# Patient Record
Sex: Female | Born: 1985 | Race: Asian | Hispanic: No | Smoking: Never smoker
Health system: Southern US, Community
[De-identification: ages and names within clinical notes are randomized; demographics above are authoritative.]

## PROBLEM LIST (undated history)

## (undated) ENCOUNTER — Encounter

## (undated) ENCOUNTER — Telehealth: Attending: Family | Primary: Family

## (undated) ENCOUNTER — Telehealth

## (undated) ENCOUNTER — Encounter: Attending: Family | Primary: Family

## (undated) ENCOUNTER — Ambulatory Visit

## (undated) ENCOUNTER — Encounter: Attending: Gastroenterology | Primary: Gastroenterology

## (undated) ENCOUNTER — Ambulatory Visit: Payer: PRIVATE HEALTH INSURANCE | Attending: Gastroenterology | Primary: Gastroenterology

## (undated) ENCOUNTER — Inpatient Hospital Stay (HOSPITAL_COMMUNITY): Payer: Self-pay

## (undated) DIAGNOSIS — F32A Depression, unspecified: Secondary | ICD-10-CM

## (undated) DIAGNOSIS — O99345 Other mental disorders complicating the puerperium: Secondary | ICD-10-CM

## (undated) DIAGNOSIS — F53 Postpartum depression: Secondary | ICD-10-CM

## (undated) DIAGNOSIS — F419 Anxiety disorder, unspecified: Secondary | ICD-10-CM

## (undated) DIAGNOSIS — O329XX0 Maternal care for malpresentation of fetus, unspecified, not applicable or unspecified: Secondary | ICD-10-CM

## (undated) DIAGNOSIS — R0600 Dyspnea, unspecified: Secondary | ICD-10-CM

## (undated) DIAGNOSIS — K759 Inflammatory liver disease, unspecified: Secondary | ICD-10-CM

## (undated) DIAGNOSIS — J45909 Unspecified asthma, uncomplicated: Secondary | ICD-10-CM

## (undated) DIAGNOSIS — F329 Major depressive disorder, single episode, unspecified: Secondary | ICD-10-CM

## (undated) HISTORY — PX: NO PAST SURGERIES: SHX2092

## (undated) HISTORY — DX: Unspecified asthma, uncomplicated: J45.909

## (undated) HISTORY — DX: Other mental disorders complicating the puerperium: O99.345

## (undated) HISTORY — DX: Anxiety disorder, unspecified: F41.9

## (undated) HISTORY — DX: Postpartum depression: F53.0

## (undated) HISTORY — PX: WISDOM TOOTH EXTRACTION: SHX21

## (undated) HISTORY — DX: Maternal care for malpresentation of fetus, unspecified, not applicable or unspecified: O32.9XX0

## (undated) HISTORY — DX: Dyspnea, unspecified: R06.00

---

## 2006-05-01 ENCOUNTER — Emergency Department (HOSPITAL_COMMUNITY): Admission: EM | Admit: 2006-05-01 | Discharge: 2006-05-01 | Payer: Self-pay | Admitting: Emergency Medicine

## 2006-07-24 ENCOUNTER — Ambulatory Visit (HOSPITAL_COMMUNITY): Admission: RE | Admit: 2006-07-24 | Discharge: 2006-07-24 | Payer: Self-pay | Admitting: Obstetrics & Gynecology

## 2006-07-31 ENCOUNTER — Ambulatory Visit: Payer: Self-pay | Admitting: Obstetrics and Gynecology

## 2006-07-31 ENCOUNTER — Inpatient Hospital Stay (HOSPITAL_COMMUNITY): Admission: AD | Admit: 2006-07-31 | Discharge: 2006-07-31 | Payer: Self-pay | Admitting: Obstetrics and Gynecology

## 2006-12-04 ENCOUNTER — Inpatient Hospital Stay (HOSPITAL_COMMUNITY): Admission: AD | Admit: 2006-12-04 | Discharge: 2006-12-04 | Payer: Self-pay | Admitting: Gynecology

## 2006-12-04 ENCOUNTER — Ambulatory Visit: Payer: Self-pay | Admitting: Certified Nurse Midwife

## 2006-12-10 ENCOUNTER — Inpatient Hospital Stay (HOSPITAL_COMMUNITY): Admission: AD | Admit: 2006-12-10 | Discharge: 2006-12-12 | Payer: Self-pay | Admitting: Gynecology

## 2006-12-10 ENCOUNTER — Ambulatory Visit: Payer: Self-pay | Admitting: Certified Nurse Midwife

## 2006-12-30 ENCOUNTER — Inpatient Hospital Stay (HOSPITAL_COMMUNITY): Admission: AD | Admit: 2006-12-30 | Discharge: 2006-12-30 | Payer: Self-pay | Admitting: Obstetrics & Gynecology

## 2006-12-30 ENCOUNTER — Ambulatory Visit: Payer: Self-pay | Admitting: *Deleted

## 2008-01-02 ENCOUNTER — Inpatient Hospital Stay (HOSPITAL_COMMUNITY): Admission: AD | Admit: 2008-01-02 | Discharge: 2008-01-02 | Payer: Self-pay | Admitting: Obstetrics and Gynecology

## 2011-04-25 LAB — POCT PREGNANCY, URINE
Operator id: 11741
Preg Test, Ur: POSITIVE

## 2011-05-16 LAB — URINALYSIS, ROUTINE W REFLEX MICROSCOPIC
Bilirubin Urine: NEGATIVE
Ketones, ur: NEGATIVE
Nitrite: NEGATIVE
Protein, ur: NEGATIVE
pH: 7.5

## 2011-05-16 LAB — CBC
Hemoglobin: 13.7
MCHC: 32.2
Platelets: 356
RDW: 16.2 — ABNORMAL HIGH

## 2012-10-27 ENCOUNTER — Ambulatory Visit (INDEPENDENT_AMBULATORY_CARE_PROVIDER_SITE_OTHER): Payer: PRIVATE HEALTH INSURANCE | Admitting: Family Medicine

## 2012-10-27 VITALS — BP 90/60 | HR 68 | Temp 98.6°F | Resp 12 | Ht 62.0 in | Wt 119.0 lb

## 2012-10-27 DIAGNOSIS — M659 Synovitis and tenosynovitis, unspecified: Secondary | ICD-10-CM

## 2012-10-27 DIAGNOSIS — N6012 Diffuse cystic mastopathy of left breast: Secondary | ICD-10-CM

## 2012-10-27 DIAGNOSIS — M7750 Other enthesopathy of unspecified foot: Secondary | ICD-10-CM

## 2012-10-27 DIAGNOSIS — K219 Gastro-esophageal reflux disease without esophagitis: Secondary | ICD-10-CM

## 2012-10-27 DIAGNOSIS — N6019 Diffuse cystic mastopathy of unspecified breast: Secondary | ICD-10-CM

## 2012-10-27 MED ORDER — PREDNISONE 20 MG PO TABS: ORAL_TABLET | ORAL | Status: DC

## 2012-10-27 NOTE — Progress Notes (Signed)
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

## 2012-10-27 NOTE — Patient Instructions (Addendum)
Gastroesophageal Reflux Disease, Adult Gastroesophageal reflux disease (GERD) happens when acid from your stomach flows up into the esophagus. When acid comes in contact with the esophagus, the acid causes soreness (inflammation) in the esophagus. Over time, GERD may create small holes (ulcers) in the lining of the esophagus. CAUSES   Increased body weight. This puts pressure on the stomach, making acid rise from the stomach into the esophagus.  Smoking. This increases acid production in the stomach.  Drinking alcohol. This causes decreased pressure in the lower esophageal sphincter (valve or ring of muscle between the esophagus and stomach), allowing acid from the stomach into the esophagus.  Late evening meals and a full stomach. This increases pressure and acid production in the stomach.  A malformed lower esophageal sphincter. Sometimes, no cause is found. SYMPTOMS   Burning pain in the lower part of the mid-chest behind the breastbone and in the mid-stomach area. This may occur twice a week or more often.  Trouble swallowing.  Sore throat.  Dry cough.  Asthma-like symptoms including chest tightness, shortness of breath, or wheezing. DIAGNOSIS  Your caregiver may be able to diagnose GERD based on your symptoms. In some cases, X-rays and other tests may be done to check for complications or to check the condition of your stomach and esophagus. TREATMENT  Your caregiver may recommend over-the-counter or prescription medicines to help decrease acid production. Ask your caregiver before starting or adding any new medicines.  HOME CARE INSTRUCTIONS   Change the factors that you can control. Ask your caregiver for guidance concerning weight loss, quitting smoking, and alcohol consumption.  Avoid foods and drinks that make your symptoms worse, such as:  Caffeine or alcoholic drinks.  Chocolate.  Peppermint or mint flavorings.  Garlic and onions.  Spicy foods.  Citrus fruits,  such as oranges, lemons, or limes.  Tomato-based foods such as sauce, chili, salsa, and pizza.  Fried and fatty foods.  Avoid lying down for the 3 hours prior to your bedtime or prior to taking a nap.  Eat small, frequent meals instead of large meals.  Wear loose-fitting clothing. Do not wear anything tight around your waist that causes pressure on your stomach.  Raise the head of your bed 6 to 8 inches with wood blocks to help you sleep. Extra pillows will not help.  Only take over-the-counter or prescription medicines for pain, discomfort, or fever as directed by your caregiver.  Do not take aspirin, ibuprofen, or other nonsteroidal anti-inflammatory drugs (NSAIDs). SEEK IMMEDIATE MEDICAL CARE IF:   You have pain in your arms, neck, jaw, teeth, or back.  Your pain increases or changes in intensity or duration.  You develop nausea, vomiting, or sweating (diaphoresis).  You develop shortness of breath, or you faint.  Your vomit is green, yellow, black, or looks like coffee grounds or blood.  Your stool is red, bloody, or black. These symptoms could be signs of other problems, such as heart disease, gastric bleeding, or esophageal bleeding. MAKE SURE YOU:   Understand these instructions.  Will watch your condition.  Will get help right away if you are not doing well or get worse. Document Released: 04/24/2005 Document Revised: 10/07/2011 Document Reviewed: 02/01/2011 [facility] Patient Information 2013 [place], [place]. Fibrocystic Breast Changes Fibrocystic breast changes is a non-cancerous(benign) condition that about half of all women have at some time in their life. It is also called benign breast disease and mammary dysplasia. It may also be called fibrocystic breast disease, but it is not  really a disease. It is a common condition that occurs when women go through the hormonal changes during their menstrual cycle, between the ages of 12 to 21. Menopausal women do not have  this problem, unless they are on hormone therapy. It can affect one or both breasts. This is not a sign that you will later get cancer. CAUSES  Overgrowth of cells lining the milk ducts, or enlarged lobules in the breast, cause the breast duct to become blocked. The duct then fills up with fluid. This is like a small balloon filled with water. It is called a cyst. Over time, with repeated inflammation there is a tendency to form scar tissue. This scar tissue becomes the fibrous part of fibrocystic disease. The exact cause of this happening is not known, but it may be related to the female hormones, estrogen and progesterone. Heredity (genetics) may also be a factor in some cases. SYMPTOMS   Tenderness.  Swelling.  Rope-like feeling.  Lumpy breast, one or both sides.  Changes in the size of the breasts, before and after the menstrual period (larger before, smaller after).  Green or dark brown nipple discharge (not blood). Symptoms are usually worse before periods (menstrual cycle) and get better toward the end of menstruation. Usually, it is temporary minor discomfort. But some women have severe pain.  DIAGNOSIS  Check your breasts monthly. The best time to check your breasts is after your period. If you check them during your period, you are more likely to feel the normal glands enlarged, as a result of the hormonal changes that happen right before your period. If you do not have menstrual periods, check your breasts the first day of every month. Become familiar with the way your own breasts feel. It is then easier to notice if there are changes, such as more tenderness, a new growth, change in breast size, or a change in a lump that has always been there. All breasts lumps need to be investigated, to rule out breast cancer. See your caregiver as soon as possible, if you find a lump. Most breast lumps are not cancerous. Excellent treatment is available for ones that are.  To make a diagnosis, your  caregiver will examine your breasts and may recommend other tests, such as:  Mammogram (breast X-ray).  Ultrasound.  MRI (magnetic resonance imaging).  Removing fluid from the cyst with a fine needle, under local anesthesia (aspiration).  Taking a breast tissue sample (breast biopsy). Some questions your caregiver will ask are:  What was the date of your last period?  When did the lump show up?  Is there any discharge from your breast?  Is the breast tender or painful?  Are the symptoms in one or both breasts?  Has the lump changed in size from month-to-month? How long has it been present?  Any family history of breast problems?  Any past breast problems?  Any history of breast surgery?  Are you taking any medications?  When was your last mammogram, and where was it done? TREATMENT   Dietary changes help to prevent or reduce the symptoms of fibrocystic breast changes.  You may need to stop consuming all foods that contain caffeine, such as chocolate, sodas, coffee, and tea.  Reducing sugar and fat in your diet may also help.  Decrease estrogen in your diet. Some sources include commercially raised meats which contain estrogen. Eliminate other natural estrogens.  Birth control pills can also make symptoms worse.  Natural progesterone cream, applied at  a dose of 15 to 20 milligrams per day, from ovulation until a day or two before your period returns, may help with returning to normal breast tissue over several months. Seek advice from your caregiver.  Over-the-counter pain pills may help, as recommended by your caregiver.  Danazol hormone (female-like hormone) is sometimes used. It may cause hair growth and acne.  Needle aspiration can be used, to remove fluid from the cyst.  Surgery may be needed, to remove a large, persistent, and tender cyst.  Evening primrose oil may help with the tenderness and pain. It has linolenic acid that women may not have enough  of. HOME CARE INSTRUCTIONS   Examine your breasts after every menstrual period.  If you do not have menstrual periods, examine your breasts the first day of every month.  Wear a firm support bra, especially when exercising.  Decrease or avoid caffeine in your diet.  Decrease the fat and sugar in your diet.  Eat a balanced diet.  Try to see your caregiver after you have a menstrual period.  Before seeing your caregiver, make notes about:  When you have the symptoms.  What types of symptoms you are having.  Medications you are taking.  When and where your last mammogram was taken.  Past breast problems or breast surgery. SEEK MEDICAL CARE IF:   You have been diagnosed with fibrocystic breast changes, and you develop changes in your breast:  Discharge from the nipple, especially bloody discharge.  Pain in the breast that does not go away after your menstrual period.  New lumps or bumps in the breast.  Lumps in your armpit.  Your breast or breasts become enlarged, red, and painful.  You find an isolated lump, even if it is not tender.  You have questions about this condition that have not been answered. Document Released: 05/01/2006 Document Revised: 10/07/2011 Document Reviewed: 07/26/2009 [facility] Patient Information 2013 [place], [place].

## 2012-12-01 ENCOUNTER — Encounter: Payer: Self-pay | Admitting: Family Medicine

## 2013-03-11 ENCOUNTER — Ambulatory Visit (INDEPENDENT_AMBULATORY_CARE_PROVIDER_SITE_OTHER): Payer: PRIVATE HEALTH INSURANCE | Admitting: Family Medicine

## 2013-03-11 ENCOUNTER — Encounter: Payer: Self-pay | Admitting: Family Medicine

## 2013-03-11 VITALS — BP 108/68 | HR 83 | Temp 98.0°F | Resp 17 | Ht 61.0 in | Wt 115.0 lb

## 2013-03-11 VITALS — BP 108/68 | HR 64 | Temp 98.0°F | Resp 17 | Ht 61.0 in | Wt 108.0 lb

## 2013-03-11 DIAGNOSIS — R350 Frequency of micturition: Secondary | ICD-10-CM

## 2013-03-11 DIAGNOSIS — R300 Dysuria: Secondary | ICD-10-CM

## 2013-03-11 DIAGNOSIS — R109 Unspecified abdominal pain: Secondary | ICD-10-CM

## 2013-03-11 DIAGNOSIS — N390 Urinary tract infection, site not specified: Secondary | ICD-10-CM

## 2013-03-11 DIAGNOSIS — R1030 Lower abdominal pain, unspecified: Secondary | ICD-10-CM

## 2013-03-11 LAB — POCT URINALYSIS DIPSTICK
Glucose, UA: NEGATIVE
Nitrite, UA: NEGATIVE
Protein, UA: NEGATIVE
Spec Grav, UA: 1.02
Urobilinogen, UA: 0.2

## 2013-03-11 LAB — POCT URINE PREGNANCY: Preg Test, Ur: NEGATIVE

## 2013-03-11 LAB — POCT UA - MICROSCOPIC ONLY
Casts, Ur, LPF, POC: NEGATIVE
Crystals, Ur, HPF, POC: NEGATIVE
Yeast, UA: NEGATIVE

## 2013-03-11 MED ORDER — NITROFURANTOIN MONOHYD MACRO 100 MG PO CAPS: 100.0000 mg | ORAL_CAPSULE | Freq: Two times a day (BID) | ORAL | Status: DC

## 2013-03-11 MED ORDER — PHENAZOPYRIDINE HCL 100 MG PO TABS: 100.0000 mg | ORAL_TABLET | Freq: Three times a day (TID) | ORAL | Status: DC | PRN

## 2013-03-11 NOTE — Progress Notes (Deleted)
[facility] [address], [place] [address] [phone]  Date:  03/11/2013   Name:  Michele Galvan   DOB:  [birth year]   MRN:  [id number]  PCP:  No primary provider on file.    Chief Complaint: Urinary Tract Infection   History of Present Illness:  Michele Galvan is a 27 y.o. very pleasant female patient who presents with the following:  ***  There are no active problems to display for this patient.   History reviewed. No pertinent past medical history.  History reviewed. No pertinent past surgical history.  History  Substance Use Topics  . Smoking status: Never Smoker   . Smokeless tobacco: Not on file  . Alcohol Use: Yes    History reviewed. No pertinent family history.  Not on File  Medication list has been reviewed and updated.  No current outpatient prescriptions on file prior to visit.   No current facility-administered medications on file prior to visit.    Review of Systems:  ***  Physical Examination: Filed Vitals:   03/11/13 1926  BP: 108/68  Pulse: 83  Temp: 98 F (36.7 C)  Resp: 17   Filed Vitals:   03/11/13 1926  Height: 5\' 1"  (1.549 m)  Weight: 115 lb (52.164 kg)   Body mass index is 21.74 kg/(m^2). Ideal Body Weight: Weight in (lb) to have BMI = 25: 132  ***  Assessment and Plan: ***  Signed Abbe Amsterdam, MD

## 2013-03-11 NOTE — Patient Instructions (Addendum)
Start antibiotics, drink plenty of fluids. Return to the clinic or go to the nearest emergency room if any of your symptoms worsen or new symptoms occur.  Urinary Tract Infection Urinary tract infections (UTIs) can develop anywhere along your urinary tract. Your urinary tract is your body's drainage system for removing wastes and extra water. Your urinary tract includes two kidneys, two ureters, a bladder, and a urethra. Your kidneys are a pair of bean-shaped organs. Each kidney is about the size of your fist. They are located below your ribs, one on each side of your spine. CAUSES Infections are caused by microbes, which are microscopic organisms, including fungi, viruses, and bacteria. These organisms are so small that they can only be seen through a microscope. Bacteria are the microbes that most commonly cause UTIs. SYMPTOMS  Symptoms of UTIs may vary by age and gender of the patient and by the location of the infection. Symptoms in young women typically include a frequent and intense urge to urinate and a painful, burning feeling in the bladder or urethra during urination. Older women and men are more likely to be tired, shaky, and weak and have muscle aches and abdominal pain. A fever may mean the infection is in your kidneys. Other symptoms of a kidney infection include pain in your back or sides below the ribs, nausea, and vomiting. DIAGNOSIS To diagnose a UTI, your caregiver will ask you about your symptoms. Your caregiver also will ask to provide a urine sample. The urine sample will be tested for bacteria and white blood cells. White blood cells are made by your body to help fight infection. TREATMENT  Typically, UTIs can be treated with medication. Because most UTIs are caused by a bacterial infection, they usually can be treated with the use of antibiotics. The choice of antibiotic and length of treatment depend on your symptoms and the type of bacteria causing your infection. HOME CARE  INSTRUCTIONS  If you were prescribed antibiotics, take them exactly as your caregiver instructs you. Finish the medication even if you feel better after you have only taken some of the medication.  Drink enough water and fluids to keep your urine clear or pale yellow.  Avoid caffeine, tea, and carbonated beverages. They tend to irritate your bladder.  Empty your bladder often. Avoid holding urine for long periods of time.  Empty your bladder before and after sexual intercourse.  After a bowel movement, women should cleanse from front to back. Use each tissue only once. SEEK MEDICAL CARE IF:   You have back pain.  You develop a fever.  Your symptoms do not begin to resolve within 3 days. SEEK IMMEDIATE MEDICAL CARE IF:   You have severe back pain or lower abdominal pain.  You develop chills.  You have nausea or vomiting.  You have continued burning or discomfort with urination. MAKE SURE YOU:   Understand these instructions.  Will watch your condition.  Will get help right away if you are not doing well or get worse. Document Released: 04/24/2005 Document Revised: 01/14/2012 Document Reviewed: 08/23/2011 [facility] Patient Information 2014 [place], [place].

## 2013-03-11 NOTE — Progress Notes (Signed)
Subjective:    Patient ID: Michele Galvan, female    DOB: 08-26-1985, 27 y.o.   MRN: [id number]  Urinary Tract Infection  Associated symptoms include frequency, nausea, urgency and vomiting. Pertinent negatives include no hematuria.   Michele Galvan is a 27 y.o. female    Yeast infection about a week go - white vaginal discharge, lasted a week - no treatment. Menses started 4 days ago - usually lasts 3-4 days.  Prior LNMP - last months. Noticed lower abd pain, slightly into back and urinary frequency since this morning. No fever. Vomiting this am, none prior.     [id number] - 85 year old son, and TAB about 2-[redacted] weeks pregnant - about 6 years ago.  Trying to become pregnant - married, no extramarital intercourse. sti testing 3 months ago - normal, no hx of sti's.   Review of Systems  Gastrointestinal: Positive for nausea, vomiting and abdominal pain.  Genitourinary: Positive for dysuria, urgency and frequency. Negative for hematuria, decreased urine volume, vaginal bleeding (stopped yesterday. ), vaginal discharge (last week only. ), difficulty urinating and menstrual problem.  Musculoskeletal: Positive for back pain.      Objective:   Physical Exam  Vitals reviewed. Constitutional: She is oriented to person, place, and time. She appears well-developed and well-nourished.  HENT:  Head: Normocephalic and atraumatic.  Pulmonary/Chest: Effort normal.  Abdominal: Soft. Normal appearance and bowel sounds are normal. She exhibits no distension. There is tenderness in the right lower quadrant, suprapubic area and left lower quadrant. There is no rigidity, no rebound, no guarding, no CVA tenderness and no tenderness at McBurney's point.  Neurological: She is alert and oriented to person, place, and time.  Skin: Skin is warm. No rash noted.  Psychiatric: She has a normal mood and affect. Her behavior is normal.   Results for orders placed in visit on 03/11/13  POCT UA - MICROSCOPIC ONLY   Result Value Range   WBC, Ur, HPF, POC tntc     RBC, urine, microscopic 2-5     Bacteria, U Microscopic trace     Mucus, UA neg     Epithelial cells, urine per micros 0-2     Crystals, Ur, HPF, POC neg     Casts, Ur, LPF, POC neg     Yeast, UA neg    POCT URINALYSIS DIPSTICK      Result Value Range   Color, UA yellow     Clarity, UA cloudy     Glucose, UA neg     Bilirubin, UA neg     Ketones, UA neg     Spec Grav, UA 1.020     Blood, UA trace-intact     pH, UA 6.0     Protein, UA neg     Urobilinogen, UA 0.2     Nitrite, UA neg     Leukocytes, UA small (1+)    POCT URINE PREGNANCY      Result Value Range   Preg Test, Ur Negative         Assessment & Plan:  Michele Galvan is a 27 y.o. female Burning with urination - Plan: POCT UA - Microscopic Only, POCT urinalysis dipstick, POCT urine pregnancy, Urine culture, phenazopyridine (PYRIDIUM) 100 MG tablet  Abdominal  pain, other specified site  UTI (urinary tract infection) - Plan: Urine culture, nitrofurantoin, macrocrystal-monohydrate, (MACROBID) 100 MG capsule, phenazopyridine (PYRIDIUM) 100 MG tablet, DISCONTINUED: nitrofurantoin, macrocrystal-monohydrate, (MACROBID) 100 MG capsule  UTI - early sx's. Start pyridium, macrobid. Urine cx,  rtc precautions.  Meds ordered this encounter  Medications  . DISCONTD: nitrofurantoin, macrocrystal-monohydrate, (MACROBID) 100 MG capsule    Sig: Take 1 capsule (100 mg total) by mouth 2 (two) times daily.    Dispense:  10 capsule    Refill:  0  . nitrofurantoin, macrocrystal-monohydrate, (MACROBID) 100 MG capsule    Sig: Take 1 capsule (100 mg total) by mouth 2 (two) times daily.    Dispense:  14 capsule    Refill:  0  . phenazopyridine (PYRIDIUM) 100 MG tablet    Sig: Take 1 tablet (100 mg total) by mouth 3 (three) times daily as needed for pain.    Dispense:  10 tablet    Refill:  0   Patient Instructions  Start antibiotics, drink plenty of fluids. Return to the clinic or  go to the nearest emergency room if any of your symptoms worsen or new symptoms occur.  Urinary Tract Infection Urinary tract infections (UTIs) can develop anywhere along your urinary tract. Your urinary tract is your body's drainage system for removing wastes and extra water. Your urinary tract includes two kidneys, two ureters, a bladder, and a urethra. Your kidneys are a pair of bean-shaped organs. Each kidney is about the size of your fist. They are located below your ribs, one on each side of your spine. CAUSES Infections are caused by microbes, which are microscopic organisms, including fungi, viruses, and bacteria. These organisms are so small that they can only be seen through a microscope. Bacteria are the microbes that most commonly cause UTIs. SYMPTOMS  Symptoms of UTIs may vary by age and gender of the patient and by the location of the infection. Symptoms in young women typically include a frequent and intense urge to urinate and a painful, burning feeling in the bladder or urethra during urination. Older women and men are more likely to be tired, shaky, and weak and have muscle aches and abdominal pain. A fever may mean the infection is in your kidneys. Other symptoms of a kidney infection include pain in your back or sides below the ribs, nausea, and vomiting. DIAGNOSIS To diagnose a UTI, your caregiver will ask you about your symptoms. Your caregiver also will ask to provide a urine sample. The urine sample will be tested for bacteria and white blood cells. White blood cells are made by your body to help fight infection. TREATMENT  Typically, UTIs can be treated with medication. Because most UTIs are caused by a bacterial infection, they usually can be treated with the use of antibiotics. The choice of antibiotic and length of treatment depend on your symptoms and the type of bacteria causing your infection. HOME CARE INSTRUCTIONS  If you were prescribed antibiotics, take them exactly  as your caregiver instructs you. Finish the medication even if you feel better after you have only taken some of the medication.  Drink enough water and fluids to keep your urine clear or pale yellow.  Avoid caffeine, tea, and carbonated beverages. They tend to irritate your bladder.  Empty your bladder often. Avoid holding urine for long periods of time.  Empty your bladder before and after sexual intercourse.  After a bowel movement, women should cleanse from front to back. Use each tissue only once. SEEK MEDICAL CARE IF:   You have back pain.  You develop a fever.  Your symptoms do not begin to resolve within 3 days. SEEK IMMEDIATE MEDICAL CARE IF:   You have severe back pain or lower abdominal pain.  You develop chills.  You have nausea or vomiting.  You have continued burning or discomfort with urination. MAKE SURE YOU:   Understand these instructions.  Will watch your condition.  Will get help right away if you are not doing well or get worse. Document Released: 04/24/2005 Document Revised: 01/14/2012 Document Reviewed: 08/23/2011 [facility] Patient Information 2014 [place], [place].

## 2013-03-11 NOTE — Progress Notes (Signed)
  Subjective:    Patient ID: Michele Galvan, female    DOB: [birth year], 27 y.o.   MRN: [id number]  HPI Michele Galvan is a 27 y.o. female  Yeast infection about a week go - white vaginal discharge, lasted a week - no treatment. Menses started 4 days ago - usually lasts 3-4 days.  Prior LNMP - last months. Noticed lower abd pain, slightly into back and urinary frequency since this morning. No fever. Vomiting this am, none prior.     [id number] - 100 year old son, and TAB about 2-[redacted] weeks pregnant - about 6 years ago.  Trying to become pregnant - married, no extramarital intercourse. sti testing 3 months ago - normal, no hx of sti's.   Review of Systems  Gastrointestinal: Positive for nausea, vomiting and abdominal pain.  Genitourinary: Positive for dysuria, urgency and frequency. Negative for hematuria, decreased urine volume, vaginal bleeding (stopped yesterday. ), vaginal discharge (last week only. ), difficulty urinating and menstrual problem.  Musculoskeletal: Positive for back pain.      Objective:   Physical Exam  Vitals reviewed. Constitutional: She is oriented to person, place, and time. She appears well-developed and well-nourished.  HENT:  Head: Normocephalic and atraumatic.  Pulmonary/Chest: Effort normal.  Abdominal: Soft. Normal appearance and bowel sounds are normal. She exhibits no distension. There is tenderness in the right lower quadrant, suprapubic area and left lower quadrant. There is no rigidity, no rebound, no guarding, no CVA tenderness and no tenderness at McBurney's point.  Neurological: She is alert and oriented to person, place, and time.  Skin: Skin is warm. No rash noted.  Psychiatric: She has a normal mood and affect. Her behavior is normal.        Assessment & Plan:   This encounter was created in error - please disregard.

## 2013-04-21 ENCOUNTER — Ambulatory Visit (INDEPENDENT_AMBULATORY_CARE_PROVIDER_SITE_OTHER): Payer: 59 | Admitting: Family Medicine

## 2013-04-21 VITALS — BP 92/60 | HR 81 | Temp 98.7°F | Resp 18 | Ht 62.5 in | Wt 119.0 lb

## 2013-04-21 DIAGNOSIS — Z0271 Encounter for disability determination: Secondary | ICD-10-CM

## 2013-04-21 DIAGNOSIS — Z331 Pregnant state, incidental: Secondary | ICD-10-CM

## 2013-04-21 DIAGNOSIS — R631 Polydipsia: Secondary | ICD-10-CM

## 2013-04-21 DIAGNOSIS — R112 Nausea with vomiting, unspecified: Secondary | ICD-10-CM

## 2013-04-21 DIAGNOSIS — R51 Headache: Secondary | ICD-10-CM

## 2013-04-21 DIAGNOSIS — Z3490 Encounter for supervision of normal pregnancy, unspecified, unspecified trimester: Secondary | ICD-10-CM

## 2013-04-21 MED ORDER — ONDANSETRON 4 MG PO TBDP: 4.0000 mg | ORAL_TABLET | Freq: Three times a day (TID) | ORAL | Status: DC | PRN

## 2013-04-21 NOTE — Patient Instructions (Signed)
Zofran one every 6-8 hours if needed for nausea and vomiting  Take Tylenol for the headaches  Push fluids  Seek wise counsel.  Consider the [facility] on [address] which can give free counselling.   Hyperemesis Gravidarum Hyperemesis gravidarum is a severe form of nausea and vomiting that happens during pregnancy. Hyperemesis is worse than morning sickness. It may cause a woman to have nausea or vomiting all day for many days. It may keep a woman from eating and drinking enough food and liquids. Hyperemesis usually occurs during the first half (the first 20 weeks) of pregnancy. It often goes away once a woman is in her second half of pregnancy. However, sometimes hyperemesis continues through an entire pregnancy.  CAUSES  The cause of this condition is not completely known but is thought to be due to changes in the body's hormones when pregnant. It could be the high level of the pregnancy hormone or an increase in estrogen in the body.  SYMPTOMS   Severe nausea and vomiting.  Nausea that does not go away.  Vomiting that does not allow you to keep any food down.  Weight loss and body fluid loss (dehydration).  Having no desire to eat or not liking food you have previously enjoyed. DIAGNOSIS  Your caregiver may ask you about your symptoms. Your caregiver may also order blood tests and urine tests to make sure something else is not causing the problem.  TREATMENT  You may only need medicine to control the problem. If medicines do not control the nausea and vomiting, you will be treated in the hospital to prevent dehydration, acidosis, weight loss, and changes in the electrolytes in your body that may harm the unborn baby (fetus). You may need intravenous (IV) fluids.  HOME CARE INSTRUCTIONS   Take all medicine as directed by your caregiver.  Try eating a couple of dry crackers or toast in the morning before getting out of bed.  Avoid foods and smells that upset your  stomach.  Avoid fatty and spicy foods. Eat 5 to 6 small meals a day.  Do not drink when eating meals. Drink between meals.  For snacks, eat high protein foods, such as cheese. Eat or suck on things that have ginger in them. Ginger helps nausea.  Avoid food preparation. The smell of food can spoil your appetite.  Avoid iron pills and iron in your multivitamins until after 3 to 4 months of being pregnant. SEEK MEDICAL CARE IF:   Your abdominal pain increases since the last time you saw your caregiver.  You have a severe headache.  You develop vision problems.  You feel you are losing weight. SEEK IMMEDIATE MEDICAL CARE IF:   You are unable to keep fluids down.  You vomit blood.  You have constant nausea and vomiting.  You have a fever.  You have excessive weakness, dizziness, fainting, or extreme thirst. MAKE SURE YOU:   Understand these instructions.  Will watch your condition.  Will get help right away if you are not doing well or get worse. Document Released: 07/15/2005 Document Revised: 10/07/2011 Document Reviewed: 10/15/2010 [facility] Patient Information 2014 [place], [place].

## 2013-04-21 NOTE — Progress Notes (Signed)
Subjective: 27-year-old lady who is here with a history of having last missed her period on August 10. Over the last week she's had severe nausea and vomiting and not able to keep anything down. She is urinating very infrequently because she's not even able to keep much water down. She's not on any regular medications. She also has gotten quite depressed during this early phase of her pregnancy. She did go in somewhere and have a pregnancy test checked which confirmed she was pregnant. She is under the great and stress of her husband having been down in [place] for a year and has now returns saying that he has a son down there. Her only support structure here is her mother and her church.  Before this started she had a couple of weeks of excessive thirst, but that is calm down.  Objective: Gen. a healthy-appearing young lady. TMs normal. Eyes PERRLA. EOMs intact. Fundi benign discs sharp with no papilledema. Neck supple without nodes. Chest is clear. Heart regular. Abdomen soft. The tip of the uterus seems to be palpable on a low abdominal examination in this slender lady.  Assessment: Nausea and vomiting Pregnancy Headache Probable dehydration  Plan: Zofran Take Tylenol for the headaches Push fluids Seek wise counsel  Check glucose because of the excessive thirst history  Fill out FMLA forms

## 2013-05-03 LAB — OB RESULTS CONSOLE RUBELLA ANTIBODY, IGM: Rubella: IMMUNE

## 2013-05-03 LAB — OB RESULTS CONSOLE HIV ANTIBODY (ROUTINE TESTING): HIV: NONREACTIVE

## 2013-05-03 LAB — OB RESULTS CONSOLE RPR: RPR: NONREACTIVE

## 2013-05-03 LAB — OB RESULTS CONSOLE HEPATITIS B SURFACE ANTIGEN: HEP B S AG: POSITIVE

## 2013-05-03 LAB — OB RESULTS CONSOLE ABO/RH: RH Type: POSITIVE

## 2013-05-04 ENCOUNTER — Other Ambulatory Visit (HOSPITAL_COMMUNITY): Payer: Self-pay | Admitting: Physician Assistant

## 2013-05-04 DIAGNOSIS — O3680X0 Pregnancy with inconclusive fetal viability, not applicable or unspecified: Secondary | ICD-10-CM

## 2013-05-04 DIAGNOSIS — O26849 Uterine size-date discrepancy, unspecified trimester: Secondary | ICD-10-CM

## 2013-05-06 ENCOUNTER — Ambulatory Visit (HOSPITAL_COMMUNITY): Payer: 59 | Attending: Physician Assistant

## 2013-05-14 ENCOUNTER — Ambulatory Visit (HOSPITAL_COMMUNITY): Payer: 59

## 2013-06-03 LAB — OB RESULTS CONSOLE GC/CHLAMYDIA
Chlamydia: NEGATIVE
Gonorrhea: NEGATIVE

## 2013-06-07 ENCOUNTER — Ambulatory Visit (HOSPITAL_COMMUNITY): Admission: RE | Admit: 2013-06-07 | Payer: 59 | Source: Ambulatory Visit

## 2013-06-07 ENCOUNTER — Ambulatory Visit (HOSPITAL_COMMUNITY)
Admission: RE | Admit: 2013-06-07 | Discharge: 2013-06-07 | Disposition: A | Discharge status: Home / Self Care | Payer: 59 | Source: Ambulatory Visit | Attending: Physician Assistant | Admitting: Physician Assistant

## 2013-06-07 ENCOUNTER — Other Ambulatory Visit (HOSPITAL_COMMUNITY): Payer: Self-pay | Admitting: Physician Assistant

## 2013-06-07 DIAGNOSIS — O26849 Uterine size-date discrepancy, unspecified trimester: Secondary | ICD-10-CM

## 2013-06-07 DIAGNOSIS — O3680X0 Pregnancy with inconclusive fetal viability, not applicable or unspecified: Secondary | ICD-10-CM

## 2013-06-07 DIAGNOSIS — Z3689 Encounter for other specified antenatal screening: Secondary | ICD-10-CM | POA: Insufficient documentation

## 2013-07-29 NOTE — L&D Delivery Note (Signed)
Delivery Note At 5:40 PM a viable female was delivered via Vaginal, Spontaneous Delivery (Presentation: Right Occiput Anterior).  APGAR: 8, 9; weight TBD.   Placenta status: Intact, Spontaneous.  Cord: 3 vessels with the following complications: None.    Anesthesia: Epidural  Episiotomy: None Lacerations: na Suture Repair: na Est. Blood Loss (mL): 350  Mom to postpartum.  Baby to Couplet care / Skin to Skin.  Pt pushed with good maternal effort to deliver a liveborn female via NSVD. Floppy tone noted.   Cord cut and baby transferred to warmer for resuscitation which was accomplished with vigorous toweling.  Placenta delivered intact with 3V cord via traction and pitocin.  no tears. No complications. Baby transferred to maternal abdomen.  Mom and baby to postpartum. Cord pH: 7.167.    Michele Galvan 11/29/2013, 6:34 PM

## 2013-08-02 ENCOUNTER — Other Ambulatory Visit (HOSPITAL_COMMUNITY): Payer: Self-pay | Admitting: Nurse Practitioner

## 2013-08-02 DIAGNOSIS — Z3689 Encounter for other specified antenatal screening: Secondary | ICD-10-CM

## 2013-08-06 ENCOUNTER — Ambulatory Visit (HOSPITAL_COMMUNITY)
Admission: RE | Admit: 2013-08-06 | Discharge: 2013-08-06 | Disposition: A | Discharge status: Home / Self Care | Payer: 59 | Source: Ambulatory Visit | Attending: Nurse Practitioner | Admitting: Nurse Practitioner

## 2013-08-06 DIAGNOSIS — Z3689 Encounter for other specified antenatal screening: Secondary | ICD-10-CM | POA: Insufficient documentation

## 2013-09-09 DIAGNOSIS — B181 Chronic viral hepatitis B without delta-agent: Secondary | ICD-10-CM | POA: Insufficient documentation

## 2013-09-14 ENCOUNTER — Encounter: Payer: Self-pay | Admitting: Obstetrics and Gynecology

## 2013-10-13 ENCOUNTER — Other Ambulatory Visit (HOSPITAL_COMMUNITY): Payer: Self-pay | Admitting: Physician Assistant

## 2013-10-15 ENCOUNTER — Ambulatory Visit (HOSPITAL_COMMUNITY)
Admission: RE | Admit: 2013-10-15 | Discharge: 2013-10-15 | Disposition: A | Discharge status: Home / Self Care | Payer: 59 | Source: Ambulatory Visit | Attending: Physician Assistant | Admitting: Physician Assistant

## 2013-10-15 DIAGNOSIS — O3660X0 Maternal care for excessive fetal growth, unspecified trimester, not applicable or unspecified: Secondary | ICD-10-CM | POA: Insufficient documentation

## 2013-11-29 ENCOUNTER — Inpatient Hospital Stay (HOSPITAL_COMMUNITY): Payer: 59 | Admitting: Anesthesiology

## 2013-11-29 ENCOUNTER — Encounter (HOSPITAL_COMMUNITY): Payer: Self-pay | Admitting: *Deleted

## 2013-11-29 ENCOUNTER — Encounter (HOSPITAL_COMMUNITY): Payer: 59 | Admitting: Anesthesiology

## 2013-11-29 ENCOUNTER — Inpatient Hospital Stay (HOSPITAL_COMMUNITY)
Admission: AD | Admit: 2013-11-29 | Discharge: 2013-11-30 | DRG: 775 | Disposition: A | Discharge status: Home / Self Care | Payer: 59 | Source: Ambulatory Visit | Attending: Obstetrics & Gynecology | Admitting: Obstetrics & Gynecology

## 2013-11-29 DIAGNOSIS — IMO0001 Reserved for inherently not codable concepts without codable children: Secondary | ICD-10-CM

## 2013-11-29 DIAGNOSIS — B181 Chronic viral hepatitis B without delta-agent: Secondary | ICD-10-CM

## 2013-11-29 HISTORY — DX: Depression, unspecified: F32.A

## 2013-11-29 HISTORY — DX: Inflammatory liver disease, unspecified: K75.9

## 2013-11-29 HISTORY — DX: Major depressive disorder, single episode, unspecified: F32.9

## 2013-11-29 LAB — TYPE AND SCREEN
ABO/RH(D): O POS
Antibody Screen: NEGATIVE

## 2013-11-29 LAB — CBC
HCT: 34.1 % — ABNORMAL LOW (ref 36.0–46.0)
Hemoglobin: 11.4 g/dL — ABNORMAL LOW (ref 12.0–15.0)
MCH: 26.9 pg (ref 26.0–34.0)
MCHC: 33.4 g/dL (ref 30.0–36.0)
MCV: 80.4 fL (ref 78.0–100.0)
Platelets: 194 10*3/uL (ref 150–400)
RBC: 4.24 MIL/uL (ref 3.87–5.11)
RDW: 14.6 % (ref 11.5–15.5)
WBC: 9.5 10*3/uL (ref 4.0–10.5)

## 2013-11-29 LAB — GROUP B STREP BY PCR: Group B strep by PCR: NEGATIVE

## 2013-11-29 LAB — ABO/RH: ABO/RH(D): O POS

## 2013-11-29 LAB — RPR

## 2013-11-29 LAB — OB RESULTS CONSOLE GBS: GBS: NEGATIVE

## 2013-11-29 MED ORDER — LACTATED RINGERS IV SOLN
500.0000 mL | Freq: Once | INTRAVENOUS | Status: AC
  Administered 2013-11-29: 500 mL via INTRAVENOUS

## 2013-11-29 MED ORDER — SIMETHICONE 80 MG PO CHEW: 80.0000 mg | CHEWABLE_TABLET | ORAL | Status: DC | PRN

## 2013-11-29 MED ORDER — OXYTOCIN 40 UNITS IN LACTATED RINGERS INFUSION - SIMPLE MED
1.0000 m[IU]/min | INTRAVENOUS | Status: DC
  Administered 2013-11-29: 2 m[IU]/min via INTRAVENOUS
  Filled 2013-11-29: qty 1000

## 2013-11-29 MED ORDER — LIDOCAINE HCL (PF) 1 % IJ SOLN
30.0000 mL | INTRAMUSCULAR | Status: DC | PRN
  Filled 2013-11-29: qty 30

## 2013-11-29 MED ORDER — FENTANYL CITRATE 0.05 MG/ML IJ SOLN
100.0000 ug | INTRAMUSCULAR | Status: DC | PRN
  Administered 2013-11-29: 100 ug via INTRAVENOUS
  Filled 2013-11-29: qty 2

## 2013-11-29 MED ORDER — PHENYLEPHRINE 40 MCG/ML (10ML) SYRINGE FOR IV PUSH (FOR BLOOD PRESSURE SUPPORT)
80.0000 ug | PREFILLED_SYRINGE | INTRAVENOUS | Status: DC | PRN
  Filled 2013-11-29: qty 2

## 2013-11-29 MED ORDER — EPHEDRINE 5 MG/ML INJ
10.0000 mg | INTRAVENOUS | Status: DC | PRN
  Filled 2013-11-29: qty 2
  Filled 2013-11-29: qty 4

## 2013-11-29 MED ORDER — OXYCODONE-ACETAMINOPHEN 5-325 MG PO TABS
1.0000 | ORAL_TABLET | ORAL | Status: DC | PRN
  Administered 2013-11-30: 2 via ORAL
  Administered 2013-11-30 (×4): 1 via ORAL
  Filled 2013-11-29 (×2): qty 1
  Filled 2013-11-29: qty 2
  Filled 2013-11-29 (×2): qty 1

## 2013-11-29 MED ORDER — BENZOCAINE-MENTHOL 20-0.5 % EX AERO: 1.0000 "application " | INHALATION_SPRAY | CUTANEOUS | Status: DC | PRN

## 2013-11-29 MED ORDER — IBUPROFEN 600 MG PO TABS: 600.0000 mg | ORAL_TABLET | Freq: Four times a day (QID) | ORAL | Status: DC

## 2013-11-29 MED ORDER — LACTATED RINGERS IV SOLN
INTRAVENOUS | Status: DC
  Administered 2013-11-29 (×2): via INTRAVENOUS

## 2013-11-29 MED ORDER — FENTANYL 2.5 MCG/ML BUPIVACAINE 1/10 % EPIDURAL INFUSION (WH - ANES)
14.0000 mL/h | INTRAMUSCULAR | Status: DC | PRN
  Administered 2013-11-29: 14 mL/h via EPIDURAL
  Filled 2013-11-29 (×2): qty 125

## 2013-11-29 MED ORDER — TENOFOVIR DISOPROXIL FUMARATE 300 MG PO TABS
300.0000 mg | ORAL_TABLET | Freq: Every day | ORAL | Status: DC
  Filled 2013-11-29 (×2): qty 1

## 2013-11-29 MED ORDER — DIBUCAINE 1 % RE OINT: 1.0000 "application " | TOPICAL_OINTMENT | RECTAL | Status: DC | PRN

## 2013-11-29 MED ORDER — ACETAMINOPHEN 325 MG PO TABS
650.0000 mg | ORAL_TABLET | ORAL | Status: DC | PRN
  Administered 2013-11-29: 650 mg via ORAL
  Filled 2013-11-29: qty 2

## 2013-11-29 MED ORDER — TETANUS-DIPHTH-ACELL PERTUSSIS 5-2.5-18.5 LF-MCG/0.5 IM SUSP: 0.5000 mL | Freq: Once | INTRAMUSCULAR | Status: DC

## 2013-11-29 MED ORDER — ONDANSETRON HCL 4 MG/2ML IJ SOLN: 4.0000 mg | Freq: Four times a day (QID) | INTRAMUSCULAR | Status: DC | PRN

## 2013-11-29 MED ORDER — ZOLPIDEM TARTRATE 5 MG PO TABS: 5.0000 mg | ORAL_TABLET | Freq: Every evening | ORAL | Status: DC | PRN

## 2013-11-29 MED ORDER — WITCH HAZEL-GLYCERIN EX PADS: 1.0000 "application " | MEDICATED_PAD | CUTANEOUS | Status: DC | PRN

## 2013-11-29 MED ORDER — OXYTOCIN BOLUS FROM INFUSION
500.0000 mL | INTRAVENOUS | Status: DC
  Administered 2013-11-29: 500 mL via INTRAVENOUS

## 2013-11-29 MED ORDER — EPHEDRINE 5 MG/ML INJ
10.0000 mg | INTRAVENOUS | Status: DC | PRN
Start: 2013-11-29 — End: 2013-11-30
  Administered 2013-11-29: 10 mg via INTRAVENOUS
  Filled 2013-11-29: qty 2

## 2013-11-29 MED ORDER — PRENATAL MULTIVITAMIN CH
1.0000 | ORAL_TABLET | Freq: Every day | ORAL | Status: DC
  Administered 2013-11-30: 1 via ORAL
  Filled 2013-11-29: qty 1

## 2013-11-29 MED ORDER — OXYCODONE-ACETAMINOPHEN 5-325 MG PO TABS: 1.0000 | ORAL_TABLET | ORAL | Status: DC | PRN

## 2013-11-29 MED ORDER — LIDOCAINE HCL (PF) 1 % IJ SOLN
INTRAMUSCULAR | Status: DC | PRN
  Administered 2013-11-29 (×3): 5 mL

## 2013-11-29 MED ORDER — LANOLIN HYDROUS EX OINT: TOPICAL_OINTMENT | CUTANEOUS | Status: DC | PRN

## 2013-11-29 MED ORDER — ONDANSETRON HCL 4 MG PO TABS: 4.0000 mg | ORAL_TABLET | ORAL | Status: DC | PRN

## 2013-11-29 MED ORDER — TERBUTALINE SULFATE 1 MG/ML IJ SOLN
0.2500 mg | Freq: Once | INTRAMUSCULAR | Status: AC | PRN
Start: 2013-11-29 — End: 2013-11-29

## 2013-11-29 MED ORDER — PHENYLEPHRINE 40 MCG/ML (10ML) SYRINGE FOR IV PUSH (FOR BLOOD PRESSURE SUPPORT)
80.0000 ug | PREFILLED_SYRINGE | INTRAVENOUS | Status: DC | PRN
  Filled 2013-11-29: qty 2
  Filled 2013-11-29: qty 10

## 2013-11-29 MED ORDER — ONDANSETRON HCL 4 MG/2ML IJ SOLN: 4.0000 mg | INTRAMUSCULAR | Status: DC | PRN

## 2013-11-29 MED ORDER — FENTANYL 2.5 MCG/ML BUPIVACAINE 1/10 % EPIDURAL INFUSION (WH - ANES)
INTRAMUSCULAR | Status: DC | PRN
  Administered 2013-11-29: 14 mL/h via EPIDURAL

## 2013-11-29 MED ORDER — DIPHENHYDRAMINE HCL 50 MG/ML IJ SOLN: 12.5000 mg | INTRAMUSCULAR | Status: DC | PRN

## 2013-11-29 MED ORDER — SENNOSIDES-DOCUSATE SODIUM 8.6-50 MG PO TABS
2.0000 | ORAL_TABLET | ORAL | Status: DC
  Filled 2013-11-29: qty 2

## 2013-11-29 MED ORDER — LACTATED RINGERS IV SOLN
500.0000 mL | INTRAVENOUS | Status: DC | PRN
  Administered 2013-11-29: 1000 mL via INTRAVENOUS
  Administered 2013-11-29: 500 mL via INTRAVENOUS

## 2013-11-29 MED ORDER — IBUPROFEN 600 MG PO TABS
600.0000 mg | ORAL_TABLET | Freq: Four times a day (QID) | ORAL | Status: DC | PRN
Start: 2013-11-29 — End: 2013-11-29
  Administered 2013-11-29: 600 mg via ORAL
  Filled 2013-11-29: qty 1

## 2013-11-29 MED ORDER — DIPHENHYDRAMINE HCL 25 MG PO CAPS: 25.0000 mg | ORAL_CAPSULE | Freq: Four times a day (QID) | ORAL | Status: DC | PRN

## 2013-11-29 MED ORDER — OXYTOCIN 40 UNITS IN LACTATED RINGERS INFUSION - SIMPLE MED: 62.5000 mL/h | INTRAVENOUS | Status: DC

## 2013-11-29 MED ORDER — CITRIC ACID-SODIUM CITRATE 334-500 MG/5ML PO SOLN: 30.0000 mL | ORAL | Status: DC | PRN

## 2013-11-29 NOTE — Progress Notes (Signed)
Pt O2 sats on from 0159 and off at 0208.

## 2013-11-29 NOTE — Anesthesia Procedure Notes (Signed)
Epidural Patient location during procedure: OB  Staffing Anesthesiologist: Phillips GroutARIGNAN, Latunya Kissick Performed by: anesthesiologist   Preanesthetic Checklist Completed: patient identified, site marked, surgical consent, pre-op evaluation, timeout performed, IV checked, risks and benefits discussed and monitors and equipment checked  Epidural Patient position: sitting Prep: ChloraPrep Patient monitoring: heart rate, continuous pulse ox and blood pressure Approach: right paramedian Location: L4-L5 Injection technique: LOR saline  Needle:  Needle type: Tuohy  Needle gauge: 17 G Needle length: 9 cm and 9 Needle insertion depth: 6 cm Catheter type: closed end flexible Catheter size: 20 Guage Catheter at skin depth: 10 cm Test dose: negative  Assessment Events: blood not aspirated, injection not painful, no injection resistance, negative IV test and no paresthesia  Additional Notes   Patient tolerated the insertion well without complications.

## 2013-11-29 NOTE — Progress Notes (Addendum)
Michele DoryMellissa Bradly BienenstockMartinez is a 28 y.o. G3P1011 at 3950w2d admitted for labor.   Subjective:  Uncomfortable and feeling pressure. +FM.   Objective: BP 105/56  Pulse 86  Temp(Src) 98 F (36.7 C) (Oral)  Resp 18  Ht 5\' 1"  (1.549 m)  Wt 68.04 kg (150 lb)  BMI 28.36 kg/m2  SpO2 98%  LMP 03/08/2013 I/O last 3 completed shifts: In: -  Out: 701 [Urine:700; Emesis/NG output:1]    FHT:  FHR: 140 bpm, variability: moderate,  accelerations:  Present,  decelerations:  Absent and   UC:   irregular, every 1.5-5 minutes SVE:   Dilation: 7 Effacement (%): 80 Station: 0 Exam by:: Dr Reola CalkinsBeck  Labs: Lab Results  Component Value Date   WBC 9.5 11/29/2013   HGB 11.4* 11/29/2013   HCT 34.1* 11/29/2013   MCV 80.4 11/29/2013   PLT 194 11/29/2013    Assessment / Plan: spontaneous onset of labor and srom  Labor: progressing on pitocin but cervix now swelling. IUPC placed for improved monitoring of contraction intensity.  Fetal Wellbeing:  Category II Pain Control:  Epidural I/D:  n/a Anticipated MOD:  NSVD  Corney Knighton L Bryley Chrisman 11/29/2013, 10:28 AM

## 2013-11-29 NOTE — MAU Note (Signed)
Pt with copious amount of fluid on clothing, Dr Ike Benedom in to triage. Will do admission orders.

## 2013-11-29 NOTE — Progress Notes (Signed)
Michele Galvan is a 28 y.o. G3P1011 at 7153w2d admitted for labor and SROM  Subjective:  Doing well. Overall feeling less back pain. +FM.   Objective: BP 122/76  Pulse 66  Temp(Src) 98.2 F (36.8 C) (Oral)  Resp 20  Ht 5\' 1"  (1.549 m)  Wt 68.04 kg (150 lb)  BMI 28.36 kg/m2  SpO2 98%  LMP 03/08/2013 I/O last 3 completed shifts: In: -  Out: 701 [Urine:700; Emesis/NG output:1]    FHT:  FHR: 140 bpm, variability: moderate,  accelerations:  Present,  decelerations:  Present variable and prolonged UC:   irregular, every 1-4 minutes SVE:   Dilation: 9 Effacement (%):  (cervical swelling) Station: 0;+1 Exam by:: Dr Reola CalkinsBeck  Labs: Lab Results  Component Value Date   WBC 9.5 11/29/2013   HGB 11.4* 11/29/2013   HCT 34.1* 11/29/2013   MCV 80.4 11/29/2013   PLT 194 11/29/2013    Assessment / Plan: onset of labor and SROM  Labor: cervical swelling and some change but very minimal over last few hours. pt would like to avoid cesarean section if possible although this was discussed. adequate contractions now x4 hours.   Fetal Wellbeing:  Category II Pain Control:  Epidural I/D:  n/a Anticipated MOD:  unclear. offered to give her 2 more hours but if no change, may need to proceed with cesarean delivery due to cervical swelling.   Michele Galvan 11/29/2013, 2:56 PM

## 2013-11-29 NOTE — H&P (Signed)
Attestation of Attending Supervision of Fellow: Evaluation and management procedures were performed by the Fellow under my supervision and collaboration.  I have reviewed the Fellow's note and chart, and I agree with the management and plan.    

## 2013-11-29 NOTE — Progress Notes (Signed)
Michele Galvan is a 28 y.o. G3P0011 at 5142w2d  admitted for active labor, rupture of membranes  Subjective: Comfortable with epidural  Objective: BP 119/64  Pulse 82  Temp(Src) 97.3 F (36.3 C) (Oral)  Resp 16  Ht 5\' 1"  (1.549 m)  Wt 68.04 kg (150 lb)  BMI 28.36 kg/m2  SpO2 99%  LMP 03/08/2013      FHT:  FHR: 130s bpm, variability: moderate,  accelerations:  Present,  decelerations:  Present prolonged decel after epidural improved with reposition and phenylephrine UC:   regular, every 3-4  minutes SVE:   Dilation: 3 Effacement (%): 60 Station: -2 Exam by:: L.Stubbs, RN  Labs: Lab Results  Component Value Date   WBC 9.5 11/29/2013   HGB 11.4* 11/29/2013   HCT 34.1* 11/29/2013   MCV 80.4 11/29/2013   PLT 194 11/29/2013    Assessment / Plan: Augmentation of labor, progressing well  Labor: progressing normally on pit without issue. clear fluid. Pit at 2mU Preeclampsia:  no signs or symptoms of toxicity Fetal Wellbeing:  Category II Pain Control:  Epidural I/D:  GBS neg Anticipated MOD:  NSVD  Michele BalsamMichael R Sorina Galvan 11/29/2013, 4:39 AM

## 2013-11-29 NOTE — Anesthesia Preprocedure Evaluation (Signed)

## 2013-11-29 NOTE — MAU Note (Signed)
Pt reports ROM and contractions

## 2013-11-29 NOTE — H&P (Signed)
Michele Galvan is a 28 y.o. female G1P0 with IUP at 271w2d presenting for Rupture of membranes tonight at approximately 2200. Pt reports irregular contractions approximately every 10 minutes. 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  Past Surgical History: No past surgical history on file.  Obstetrical History: OB History   Grav Para Term Preterm Abortions TAB SAB Ect Mult Living   1               Gynecological History: OB History   Grav Para Term Preterm Abortions TAB SAB Ect Mult Living   1               Social History: History   Social History  . Marital Status: Married    Spouse Name: N/A    Number of Children: N/A  . Years of Education: N/A   Social History Main Topics  . Smoking status: Never Smoker   . Smokeless tobacco: Not on file  . Alcohol Use: Yes  . Drug Use: No  . Sexual Activity: Yes   Other Topics Concern  . Not on file   Social History Narrative   ** Merged History Encounter **        Family History: Family History  Problem Relation Age of Onset  . Anemia Mother     Allergies: No Known Allergies  Prescriptions prior to admission  Medication Sig Dispense Refill  . nitrofurantoin, macrocrystal-monohydrate, (MACROBID) 100 MG capsule Take 1 capsule (100 mg total) by mouth 2 (two) times daily.  14 capsule  0  . ondansetron (ZOFRAN ODT) 4 MG disintegrating tablet Take 1 tablet (4 mg total) by mouth every 8 (eight) hours as needed for nausea.  30 tablet  0  . phenazopyridine (PYRIDIUM) 100 MG tablet Take 1 tablet (100 mg total) by mouth 3 (three) times daily as needed for pain.  10 tablet  0     Review of Systems   Constitutional: No complaints  Last menstrual period  03/08/2013. General appearance: alert, cooperative, appears stated age and no distress Lungs: clear to auscultation bilaterally Heart: regular rate and rhythm Abdomen: soft, non-tender; bowel sounds normal Pelvic: adeqaute Extremities: Homans sign is negative, no sign of DVT DTR's 2+ Presentation: cephalic Fetal monitoringBaseline: 140s bpm, Variability: Good {> 6 bpm), Accelerations: Reactive and Decelerations: Absent Uterine activity irr q5-1343m      Prenatal labs: ABO, Rh:   0+ Antibody:  neg Rubella:  Im RPR:   Neg HBsAg:   Positive on antivirals HIV:   NR GBS:   Unknown 1 hr Glucola 97 Genetic screening  Neg quad Anatomy US Normal anatomy  o+/hepB+/RI/HIV NR, GC+ TOC neg/quad neg/DM 97/RPR Neg  Prenatal Transfer Tool  Maternal Diabetes: No Genetic Screening: Normal Maternal Ultrasounds/Referrals: Normal Fetal Ultrasounds or other Referrals:  None Maternal Substance Abuse:  No Significant Maternal Medications:  Tenofevir - HepB Significant Maternal Lab Results: Lab values include: HBsAG positive GBS unknown     No results found for this or any previous visit (from the past 24 hour(s)).  Assessment: Michele Galvan is a 28 y.o. 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

## 2013-11-30 MED ORDER — OXYCODONE-ACETAMINOPHEN 5-325 MG PO TABS: 1.0000 | ORAL_TABLET | ORAL | Status: DC | PRN

## 2013-11-30 MED ORDER — TENOFOVIR DISOPROXIL FUMARATE 300 MG PO TABS
300.0000 mg | ORAL_TABLET | Freq: Every day | ORAL | Status: DC
  Administered 2013-11-30: 300 mg via ORAL
  Filled 2013-11-30 (×2): qty 1

## 2013-11-30 NOTE — Lactation Note (Addendum)
This note was copied from the chart of Michele Galvan. Lactation Consultation Note  Patient Name: Michele Galvan [id number]'s Date: 11/30/2013 Reason for consult: Initial assessment Mom plans to breast and bottle feed. LC encouraged to BF with each feeding to encourage milk production, prevent engorgement and protect milk supply. Guidelines for supplementing with BF reviewed with and given to Mom. Advised baby should be at the breast 8-12 times in 24 hours for 15-20 minutes each breast. Mom is experienced BF. Cluster feeding discussed. Lactation brochure left for review, advised of OP services and support group. Engorgement care reviewed if needed.   Maternal Data Formula Feeding for Exclusion: Yes Reason for exclusion: Mother's choice to formula and breast feed on admission Has patient been taught Hand Expression?: Yes Does the patient have breastfeeding experience prior to this delivery?: Yes  Feeding Feeding Type: Breast Fed Length of feed: 10 min  LATCH Score/Interventions                      Lactation Tools Discussed/Used     Consult Status Consult Status: Complete    Alfred LevinsKathy Ann Ashara Lounsbury 11/30/2013, 2:02 PM

## 2013-11-30 NOTE — Anesthesia Postprocedure Evaluation (Signed)
Anesthesia Post Note  Patient: Michele LampMellissa Galvan  Procedure(s) Performed: * No procedures listed *  Anesthesia type: Epidural  Patient location: Mother/Baby  Post pain: Pain level controlled  Post assessment: Post-op Vital signs reviewed  Last Vitals:  Filed Vitals:   11/30/13 0017  BP: 92/52  Pulse: 90  Temp: 36.8 C  Resp: 18    Post vital signs: Reviewed  Level of consciousness: awake  Complications: No apparent anesthesia complications

## 2013-11-30 NOTE — Discharge Instructions (Signed)
Circuncisio'n Offices that do circumcisions:  [facility] [phone] $150 within 4 weeks of [facility] [phone] [facility]([place]) [phone] within 4 weeks of birth,  [facility] [phone] [facility]([place]) $250 within 7 days of birth, [facility] [phone] [facility]([place]) $175 within 2 weeks of birth   SpainLactancia materna (Breastfeeding) Decidir Museum/gallery exhibitions officeramamantar es una de las mejores elecciones que puede hacer por usted y su beb. El cambio hormonal durante el Psychiatristembarazo produce el desarrollo del tejido mamario y Lesothoaumenta la cantidad y el tamao de los conductos galactforos. Estas hormonas tambin permiten que las protenas, los azcares y las grasas de la sangre produzcan la WPS Resourcesleche materna en las glndulas productoras de Lauderhillleche. Las hormonas impiden que la leche materna sea liberada antes del nacimiento del beb, adems de impulsar el flujo de leche luego del nacimiento. Una vez que ha comenzado a Museum/gallery exhibitions officeramamantar, Conservation officer, naturepensar en el beb, as Immunologistcomo la succin o Theatre managerel llanto, pueden estimular la liberacin de [place] de las glndulas productoras de Centuryleche.  LOS BENEFICIOS DE AMAMANTAR Para el beb  La primera leche (calostro) ayuda al mejor funcionamiento del sistema digestivo del beb.  La leche tiene anticuerpos que ayudan a Radio producerprevenir las infecciones en el beb.  El beb tiene una menor incidencia de asma, alergias y del sndrome de muerte sbita del lactante.  Los nutrientes en la Animasleche materna son mejores para el beb que la Penn State Erieleche maternizada y estn preparados exclusivamente para cubrir las necesidades del beb.  La leche materna mejora el desarrollo cerebral del beb.  Es menos probable que el beb desarrolle otras enfermedades, como obesidad infantil, asma o diabetes mellitus de tipo 2. Para usted   La lactancia materna favorece el desarrollo de un vnculo muy especial entre la madre y el beb.  Es conveniente. Siempre est disponible a la temperatura correcta y es Gardnerville Ranchoseconmica.  La  lactancia materna ayuda a quemar caloras y a perder el peso ganado durante el Dundeeembarazo.  Favorece la contraccin del tero al tamao que tena antes del embarazo de manera ms rpida y disminuye el sangrado (loquios) despus del parto.  La lactancia materna contribuye a reducir Nurse, adultel riesgo de desarrollar diabetes mellitus de tipo 2, osteoporosis o cncer de mama o de ovario en el futuro. SIGNOS DE QUE EL BEB EST HAMBRIENTO Primeros signos de [address]  Aumenta su estado de Lesothoalerta o actividad.  Se estira.  Mueve la cabeza de un lado a otro.  Mueve la cabeza y abre la boca cuando se le toca la mejilla o la comisura de la boca (reflejo de bsqueda).  Aumenta las vocalizaciones, tales como sonidos de succin, se relame los labios, emite arrullos, suspiros, o chirridos.  Mueve la Jones Apparel Groupmano hacia la boca.  Se chupa con ganas los dedos o las manos. Signos tardos de Fisher Scientifichambre  Est agitado.  Llora de manera intermitente. Signos de AES Corporationhambre extrema Los signos de hambre extrema requerirn que lo calme y lo consuele antes de que el beb pueda alimentarse adecuadamente. No espere a que se manifiesten los siguientes signos de hambre extrema para comenzar a Museum/gallery exhibitions officeramamantar:   Designer, jewelleryAgitacin.  Llanto intenso y fuerte.   Gritos. INFORMACIN BSICA SOBRE LA LACTANCIA MATERNA Iniciacin de la lactancia materna  Encuentre un lugar cmodo para sentarse o acostarse, con un buen respaldo para el cuello y la espalda.  Coloque una almohada o una manta enrollada debajo del beb para acomodarlo a la altura de la mama (si est sentada). Las almohadas para Museum/gallery exhibitions officeramamantar se han diseado especialmente a fin  de servir de apoyo para los brazos y el beb [address]ientras amamanta.  Asegrese de que el abdomen del beb est frente al suyo.  Masajee suavemente la mama. Con las yemas de los dedos, masajee la pared del pecho hacia el pezn en un movimiento circular. Esto estimula el flujo de [place]leche. Es posible que Engineer, manufacturing systemsdeba continuar este movimiento  mientras amamanta si la leche fluye lentamente.  Sostenga la mama con el pulgar por arriba del pezn y los otros 4 dedos por debajo de la mama. Asegrese de que los dedos se encuentren lejos del pezn y de la boca del beb.  Empuje suavemente los labios del beb con el pezn o con el dedo.  Cuando la boca del beb se abra lo suficiente, acrquelo rpidamente a la mama e introduzca todo el pezn y la zona oscura que lo rodea (areola), tanto como sea posible, dentro de la boca del beb.  Debe haber ms areola visible por arriba del labio superior del beb que por debajo del labio inferior.  La lengua del beb debe estar entre la enca inferior y la Wetheringtonmama.  Asegrese de que la boca del beb est en la posicin correcta alrededor del pezn (prendida). Los labios del beb deben crear un sello sobre la mama, doblndose hacia afuera (invertidos).  Es comn que el beb succione durante 2 a 3 minutos para que comience el flujo de Plantationleche materna. Cmo debe prenderse Es muy importante que le ensee al beb cmo prenderse adecuadamente a la mama. Si el beb no se prende adecuadamente, puede causarle dolor en el pezn y reducir la produccin de Sultanaleche materna, y hacer que el beb tenga un escaso aumento de Fairfieldpeso. Adems, si el beb no se prende adecuadamente al pezn, puede tragar aire durante la alimentacin. Esto puede causarle molestias al beb. Hacer eructar al beb al Pilar Platecambiar de mama puede ayudarlo a liberar el aire. Sin embargo, ensearle al beb cmo prenderse a la mama adecuadamente es la mejor manera de evitar que se sienta molesto por tragar Oceanographeraire mientras se alimenta. Signos de que el beb se ha prendido adecuadamente al pezn:   Payton Doughtyironea o succiona de modo silencioso, sin causarle dolor.  Se escucha que traga cada 3 o 4 succiones.   Hay movimientos musculares por arriba y por delante de sus odos al Printmakersuccionar. Signos de que el beb no se ha prendido Audiological scientistadecuadamente al pezn:   Hace ruidos de succin  o de chasquido mientras se alimenta.  Dolor en el pezn. Si cree que el beb no se prendi correctamente, deslice el dedo en la comisura de la boca y Ameren Corporationcolquelo entre las encas del beb para interrumpir la succin. Intente comenzar a amamantar nuevamente. Signos de Fish farm managerlactancia materna exitosa Signos del beb:   Disminucin gradual en el nmero de succiones o cese completo de la succin.  Se duerme.  Relaja el cuerpo.  Retiene una pequea cantidad de Goldfieldleche en su boca.  Se desprende solo del pecho. Signos que presenta usted:  Las mamas han aumentado la firmeza, el peso y el tamao 1 a 3 horas despus de Museum/gallery exhibitions officeramamantar.  Estn ms blandas inmediatamente despus de amamantar.  Un aumento del volumen de Conejosleche, y tambin el cambio de su consistencia y color se producen hacia el quinto da de Tour managerlactancia materna.  Los pezones no duelen, ni estn agrietados ni sangran. Signos de que su beb recibe la cantidad de leche suficiente  Moja al menos 3 paales en 24 horas. La orina debe ser  clara y de color amarillo plido a los 5 [address]  Defeca al menos 3 veces en 24 horas a los 5 [address]  [address] de [address] La materia fecal debe ser blanda y [place].  Defeca al menos 3 veces en 24 horas a los [address] de [address] La materia fecal debe ser grumosa y [place].  No registra una prdida de peso mayor del 10% del peso al nacer durante los primeros 3 [address]  [address] de [place].  Aumenta de peso un promedio de 4 a 7onzas (120 a ) por semana despus de los 4 [address]  [address] de vida.  Aumenta de [place], [place], de [place] consistente a Glass blower/designer de los 5 [address]  [address] de vida, sin Passenger transport manager prdida de peso despus de las 2 semanas de vida. Despus de alimentarse, es posible que el beb regurgite una pequea cantidad. Esto es frecuente. FRECUENCIA Y DURACIN DE LA LACTANCIA MATERNA El amamantamiento frecuente la ayudar a producir ms [place] y a Education officer, community de Engineer, mining en los pezones e hinchazn en las [place]. Alimente al beb cuando  muestre signos de hambre o si siente la necesidad de reducir la congestin de las [place]. Esto se denomina "lactancia a demanda". Evite el uso del chupete mientras trabaja para establecer la lactancia (las primeras 4 a 6 semanas despus del nacimiento del beb). Despus de este perodo, podr ofrecerle un chupete. Las investigaciones demostraron que el uso del chupete durante el primer ao de vida del beb disminuye el riesgo de desarrollar el sndrome de muerte sbita del lactante (SMSL). Permita que el nio se alimente en cada mama todo lo que desee. Contine amamantando al beb hasta que haya terminado de alimentarse. Cuando el beb se desprende o se queda dormido mientras se est alimentando de la primera mama, ofrzcale la segunda. Debido a que, con frecuencia, los recin [place] las primeras semanas de vida, es posible que deba despertar a su beb para alimentarlo. Los horarios de Acupuncturist de un beb a otro. Sin embargo, las siguientes reglas pueden servir como gua para ayudarle a Lawyer que el beb se alimenta adecuadamente:  Se puede amamantar a los recin nacidos (bebs de 4 semanas o menos de vida) cada 1 a 3 horas.  No deben transcurrir ms de 3 horas durante el da o 5 horas durante la noche sin que se amamante a los recin nacidos.  Debe amamantar al beb 8 veces como mnimo, en un perodo de 24 horas, hasta que comience a introducir slidos en su dieta, a los 6 meses de vida aproximadamente. EXTRACCIN DE Dean Foods Company MATERNA La extraccin y Contractor de la leche materna le permiten asegurarse de que el beb se alimente exclusivamente de [place], aun en momentos en los que no puede amamantar. Esto tiene especial importancia si debe regresar al Aleen Campi en el perodo en que an est amamantando o si no puede estar presente en los momentos en que el beb debe alimentarse. Su asesor en lactancia puede orientarla sobre cunto tiempo es seguro almacenar  [place].  El sacaleche es un aparato que le permite extraer leche de la mama a un recipiente estril. Luego, la leche materna extrada puede almacenarse en un refrigerador o freezer. Algunos sacaleches son Birdie Riddle, Delaney Meigs otros son elctricos. Consulte a su asesor en lactancia qu tipo ser ms conveniente para usted. Los sacaleches se pueden comprar, sin embargo, algunos hospitales y grupos de apoyo a la lactancia materna alquilan Sports coach. Un asesor en lactancia puede ensearle cmo extraer W. [employer]. Berkley, en  caso de que prefiera no usar Buyer, retail.  CMO CUIDAR LAS MAMAS DURANTE LA LACTANCIA MATERNA Los pezones se secan, agrietan y duelen durante la Tour manager. Las siguientes recomendaciones pueden ayudarle a Pharmacologist las TEPPCO Partners y sanas:  Careers information officer usar jabn en los pezones.  Use un sostn de soporte. Aunque no son esenciales, las camisetas sin mangas o los sostenes especiales para Museum/gallery exhibitions officer estn diseados para acceder fcilmente a las mamas, para Museum/gallery exhibitions officer sin tener que quitarse todo el sostn o la camiseta. Evite usar sostenes con aro o sostenes muy ajustados.  Seque al aire sus pezones durante 3 a despus de amamantar al beb.  Utilice solo apsitos de Haematologist sostn para Environmental health practitioner las prdidas de [place]. La prdida de un poco de Public Service Enterprise Group tomas es normal.  Utilice lanolina sobre los pezones luego de Museum/gallery exhibitions officer. La lanolina ayuda a mantener la humedad normal de la piel. Si [place] lanolina pura, no tiene que lavarse los pezones antes de Corporate treasurer al beb. La lanolina pura no es txica para el beb. Adems, puede extraer Beazer Homes algunas gotas de [place] materna y Engineer, maintenance (IT) suavemente esa Winn-Dixie, para que la [place] se seque al aire. Durante las primeras semanas despus de dar a luz, algunas mujeres pueden experimentar hinchazn en las mamas (congestin [place]). La congestin puede hacer que sienta las  mamas pesadas, calientes y sensibles al tacto. El pico de la congestin ocurre dentro de los 3 a 5 das despus del [place]. Las siguientes recomendaciones pueden ayudarle a Paramedic la congestin:  Vace por completo las mamas al QUALCOMM o Environmental health practitioner. Puede aplicar calor hmedo en las mamas (en la ducha o con toallas hmedas para manos) antes de Museum/gallery exhibitions officer o extraer WPS Resources. Esto aumenta la circulacin y [place] a que la [place]. Si el beb no vaca por completo las [address] cuando lo [address], extraiga la [place] restante despus de que haya finalizado.  Use un sostn ajustado (para amamantar o comn) o camiseta sin mangas durante 1 o 2 das para indicar al cuerpo que disminuya ligeramente la produccin de [place].  Aplique compresas de hielo Yahoo! Inc, a menos que le resulte demasiado incmodo.  Asegrese de que el beb se encuentre en la posicin correcta mientras lo alimenta. Si la congestin persiste luego de 48 horas o despus de seguir estas recomendaciones, comunquese con su mdico o un Holiday representative. RECOMENDACIONES GENERALES PARA EL CUIDADO DE LA SALUD DURANTE LA LACTANCIA MATERNA  Consuma alimentos saludables. Alterne comidas y colaciones, comiendo 3 de cada Agricultural engineer. Dado que lo que come Danaher Corporation, es posible que algunas comidas hagan que su beb se vuelva ms irritable de lo habitual. Evite comer este tipo de alimentos, si percibe que afectan de manera negativa al beb.  Beba leche, jugos de fruta y agua para Patent examiner su sed (aproximadamente 10 vasos al Futures trader).  Descanse con frecuencia, reljese y tome sus vitaminas prenatales para evitar la fatiga, el estrs y la anemia.  Contine con los autocontroles de la mama.  Evite masticar y fumar tabaco.  Evite el consumo de alcohol y drogas. Algunos medicamentos, que pueden ser perjudiciales para el beb, pueden pasar a travs de la Colgate Palmolive. Es importante que consulte a su mdico antes de International aid/development worker, incluidos todos los medicamentos recetados y de [place], as como los suplementos vitamnicos y herbales. Puede quedar embarazada durante la lactancia. Si desea controlar la natalidad, consulte a su mdico  cules son las opciones ms seguras para el beb. SOLICITE ATENCIN MDICA SI:   Usted siente que quiere dejar de Museum/gallery exhibitions officer o se siente frustrada con la lactancia.  Siente dolor en las mamas o en los pezones.  Sus pezones estn agrietados o Water quality scientist.  Sus pechos estn irritados, sensibles o calientes.  Tiene un rea hinchada en cualquiera de las mamas.  Siente escalofros o fiebre.  Tiene nuseas o vmitos.  Presenta una secrecin de otro lquido distinto de la leche materna de los pezones.  Sus mamas no se llenan antes de Museum/gallery exhibitions officer al beb para el 5. da despus del parto.  Se siente triste y deprimida.  El beb est demasiado somnoliento como para comer bien.  El beb tiene problemas para dormir.  Moja menos de 3 paales en 24 horas.  Defeca menos de 3 veces en 24 horas.  La piel del beb o la parte blanca de sus ojos est amarilla.  El beb no ha aumentado de [place] a los [address] de [place]. SOLICITE ATENCIN MDICA DE INMEDIATO SI:   El beb est muy cansado (aletargado) y no se despierta para comer.  Le sube la fiebre sin causa. Document Released: 07/15/2005 Document Revised: 11/09/2012 [facility] Patient Information 2014 [place], [place]. Parto vaginal (Vaginal Delivery) Durante el parto, el mdico la ayudar a dar a luz a su beb. En el parto vaginal, deber pujar para que el beb salga por la vagina. Sin embargo, antes de que pueda sacar al beb, es necesario que ocurran ciertas cosas. La abertura del tero (cuello del tero) tiene que ablandarse, hacerse ms delgado y abrirse (dilatar) hasta que llegue a 10 cm. Adems, el beb tiene que bajar desde el tero a la vagina.  SIGNOS DE TRABAJO DE PARTO  El mdico tendr primero que asegurarse de que usted  est en [place]. Algunos signos son:   Eliminar lo que se llama tapn mucoso antes del inicio del trabajo de [place]. Este es una pequea cantidad de mucosidad teida con sangre.  Tener contracciones uterinas regulares y dolorosas.  El Bank of America las contracciones debe acortarse.  Las molestias y Chief Technology Officer se harn ms intensos gradualmente.  El dolor de las contracciones empeora al caminar y no se alivia con el reposo.  El cuello del tero se hace mas delgado (se borra) y se dilata. ANTES DEL PARTO Una vez que se inicie el trabajo de parto y sea admitida en el hospital o sanatorio, el mdico podr hacer lo siguiente:   Education officer, environmental un examen fsico.  Controlar si hay complicaciones relacionadas con Kathie Dike de parto.  Verificar su presin arterial, temperatura y pulso y la frecuencia cardaca (signos vitales).  Determinar si se ha roto el saco amnitico y cundo ha ocurrido.  Realizar un examen vaginal (utilizando un guante estril y un lubricante) para determinar:  La posicin (presentacin) del beb. El beb se presenta con la cabeza primero (vertex) en el canal de parto (vagina), o estn los pies o las nalgas primero (de nalgas)?  El nivel (estacin) de la cabeza del beb dentro del canal de parto.  El borramiento y la dilatacin del cuello uterino.  El monitor fetal electrnico generalmente se coloca sobre el abdomen al llegar. Se utiliza para controlar las contracciones y la frecuencia cardaca del beb.  Cuando el monitor est en el abdomen (monitor fetal externo), slo toma la frecuencia y la duracin de las contracciones. No informa acerca de la intensidad de las contracciones.  Si el mdico  necesita saber exactamente la intensidad de las contracciones o cul es la frecuencia cardaca del beb, colocar un monitor interno en la vagina y [place]. El mdico Liz Claiborne riesgos y los beneficios de usar un monitor interno y le pedir autorizacin antes de Scientist, product/process development  dispositivo.  El monitoreo fetal continuo ser necesario si le han aplicado una epidural, si le administran ciertos medicamentos (como oxitocina) y si tiene complicaciones del [place] o del trabajo de [place].  Podrn colocarle una va intravenosa en una vena del brazo para suministrarle lquidos y medicamentos, si es necesario. TRES ETAPAS DEL TRABAJO DE PARTO Y EL PARTO [place] de parto y el parto normales se dividen en tres etapas. Primera etapa Esta etapa comienza cuando comienzan las contracciones regulares y el cuello comienza a borrarse y dilatarse. Finaliza cuando el cuello est completamente abierto (completamente dilatado). La primera etapa es la etapa ms larga del [place] de parto y puede durar desde 3 horas a 15 horas.  Algunos mtodos estn disponibles para ayudar con el dolor del [place]. Usted y su mdico decidirn qu opcin es la mejor para usted. Las opciones incluyen:   Medicamentos narcticos. Estos son medicamentos fuertes que usted puede recibir a Games developer de una va intravenosa o como inyeccin en el msculo. Estos medicamentos Associate Professor pero no hacen que desaparezca completamente.  Epidural. Se administra un medicamento a travs de un tubo delgado que se inserta en la espalda. El medicamento adormece la parte inferior del cuerpo y evita el dolor en esa zona.  Bloqueo paracervical Es una inyeccin de un anestsico en cada lado del cuello uterino.  Usted podr pedir un parto natural, que implica que no se usen analgsicos ni epidural durante el parto y [place] de [place]. En cambio, podr tener otro tipo de ayuda como ejercicios respiratorios para hacer frente al Merck & Co. Segunda etapa La segunda etapa del trabajo de parto comienza cuando el cuello se ha dilatado completamente a 10 cm. Contina hasta que usted puja al beb hacia abajo, por el canal de [place], y el beb nace. Esta etapa puede durar slo algunos minutos o algunas horas.  La posicin del la [place] del beb  a medida que pasa por el canal de parto, es informada como un nmero, llamado estacin. Si la cabeza del beb no ha iniciado su descenso, la estacin se describe como que est en menos 3 ( 3). Cuando la cabeza del beb est en la estacin cero, est en el medio del canal de parto y se encaja en la pelvis. La estacin en la que se encuentra el beb indica el progreso de la segunda etapa del [place] de [place].  Cuando el beb nace, el mdico lo sostendr con la cabeza hacia abajo para evitar que el lquido amnitico, el moco y la sangre entren en los pulmones del beb. La boca y la nariz del beb podrn ser succionadas con un pequeo bulbo para retirar todo lquido adicional.  El mdico podr Scientific laboratory technician al beb sobre su estmago. Es importante evitar que el beb tome fro. Para hacerlo, el mdico secar al beb, lo colocar directamente sobre su piel, (sin mantas entre usted y el beb) y lo cubrir con mantas secas y tibias.  Se corta el cordn umbilical. Tercera etapa Durante la tercera etapa del trabajo de parto, el mdico sacar la placenta (alumbramiento) y se asegurar de que el sangrado est controlado. La salida de la placenta generalmente demora 5 minutos pero puede tardar hasta 30 minutos.  Luego de la salida de la placenta, le darn un medicamento por va intravenosa o inyectable para ayudar a Engineer, manufacturing tero y Air traffic controller. Si planea amamantar al beb, puede intentar en este momento. Luego de la salida de la placenta, el tero debe contraerse y [place]. Si el tero no queda firme, el mdico lo Engineer, maintenance (IT). Esto es importante debido a que la contraccin del tero ayuda a Location manager sangrado en el sitio en que la placenta estaba unida al tero. Si el tero no se contrae adecuadamente ni Terex Corporation, podr causar un sangrado abundante. Si hay mucho sangrado, podrn darle medicamentos para contraer el tero y Therapist, music.  Document Released: 06/27/2008 Document Revised:  03/17/2013 [facility] Patient Information 2014 [place], [place].

## 2013-11-30 NOTE — Progress Notes (Signed)
Post Partum Day 1 Subjective: up ad lib and breastfeeding and desires discharge home.  Pt decided to remain in hospital as admitted patient for pain control.  Desires Mirena for family planning.    Objective: Blood pressure 92/52, pulse 90, temperature 98.2 F (36.8 C), temperature source Oral, resp. rate 18, height 5\' 1"  (1.549 m), weight 68.04 kg (150 lb), SpO2 99.00%, unknown if currently breastfeeding.  Physical Exam:  Filed Vitals:   11/30/13 0017  BP: 92/52  Pulse: 90  Temp: 98.2 F (36.8 C)  Resp: 18   General: alert, cooperative and appears stated age Lochia: appropriate Uterine Fundus: firm Incision: n/a DVT Evaluation: No evidence of DVT seen on physical exam. Negative Homan's sign.   Recent Labs  11/29/13 0110  HGB 11.4*  HCT 34.1*    Assessment/Plan: Plan for discharge tomorrow   LOS: 1 day   Walidah N Muhammad 11/30/2013, 9:30 AM

## 2013-11-30 NOTE — Discharge Summary (Signed)
Obstetric Discharge Summary Reason for Admission: onset of labor Prenatal Procedures: none Intrapartum Procedures: spontaneous vaginal delivery Postpartum Procedures: none Complications-Operative and Postpartum: none Hemoglobin  Date Value Ref Range Status  11/29/2013 11.4* 12.0 - 15.0 g/dL Final     HCT  Date Value Ref Range Status  11/29/2013 34.1* 36.0 - 46.0 % Final    Physical Exam:  General: alert Lochia: appropriate CV: RRR, No MRG Lungs: CTA BL Uterine Fundus: firm, below umbilicus Incision: NA DVT Evaluation: No evidence of DVT seen on physical exam. Negative Homan's sign.  Discharge Diagnoses: Term Pregnancy-delivered  Hospital Course: Michele Galvan is a 28 y.o. G3P1011 at 3727w2d with h/o Hepatitis B in treatment with Tenofovir admitted for labor and SROM.   At 5:40 PM a viable female was delivered via Vaginal, Spontaneous Delivery (Presentation: Right Occiput Anterior).  APGAR: 8, 9; weight TBD.   Placenta status: Intact, Spontaneous.  Cord: 3 vessels with the following complications: None.    Anesthesia: Epidural  Episiotomy: None Lacerations: na Suture Repair: na Est. Blood Loss (mL): 350  Mom to postpartum.  Baby to Couplet care / Skin to Skin.  Pt pushed with good maternal effort to deliver a liveborn female via NSVD. Floppy tone noted.   Cord cut and baby transferred to warmer for resuscitation which was accomplished with vigorous toweling.  Placenta delivered intact with 3V cord via traction and pitocin.  no tears. No complications. Baby transferred to maternal abdomen.  Mom and baby to postpartum. Cord pH: 7.167.   Patient was stable during the postpartum period and was discharged home on PPD #1.  Breastfeeding and bottlefeeding.  Plans Mirena for contraception.  Continue Tenofovir for Hepatitis B.     Discharge Information: Date: 11/30/2013 Activity: pelvic rest Diet: routine Medications: None Condition: stable Instructions: refer to practice specific  booklet Discharge to: home   Newborn Data: Live born female  Birth Weight: 8 lb 1.4 oz (3668 g) APGAR: 8, 9  Home with mother.  Michele JacobsonRobyn H Galvan 11/30/2013, 9:15 AM  I examined pt and agree with documentation above and resident plan of care. Michele FarberWalidah Paul Galvan Michele Galvan, CNM

## 2013-12-02 NOTE — Discharge Summary (Signed)
Attestation of Attending Supervision of Advanced Practitioner (CNM/NP): Evaluation and management procedures were performed by the Advanced Practitioner under my supervision and collaboration.  I have reviewed the Advanced Practitioner's note and chart, and I agree with the management and plan.  Michele Galvan 2:21 PM     

## 2014-05-30 ENCOUNTER — Encounter (HOSPITAL_COMMUNITY): Payer: Self-pay | Admitting: *Deleted

## 2014-07-21 ENCOUNTER — Ambulatory Visit (INDEPENDENT_AMBULATORY_CARE_PROVIDER_SITE_OTHER): Payer: 59 | Admitting: Emergency Medicine

## 2014-07-21 VITALS — BP 110/62 | HR 74 | Temp 98.1°F | Resp 16 | Ht 62.0 in | Wt 118.2 lb

## 2014-07-21 DIAGNOSIS — N3090 Cystitis, unspecified without hematuria: Secondary | ICD-10-CM

## 2014-07-21 DIAGNOSIS — R300 Dysuria: Secondary | ICD-10-CM

## 2014-07-21 LAB — POCT URINALYSIS DIPSTICK
BILIRUBIN UA: NEGATIVE
Glucose, UA: NEGATIVE
KETONES UA: NEGATIVE
Nitrite, UA: NEGATIVE
Protein, UA: NEGATIVE
Spec Grav, UA: <=1.005
Urobilinogen, UA: 0.2
pH, UA: 7

## 2014-07-21 LAB — POCT UA - MICROSCOPIC ONLY
Casts, Ur, LPF, POC: NEGATIVE
Crystals, Ur, HPF, POC: NEGATIVE
MUCUS UA: NEGATIVE
Yeast, UA: NEGATIVE

## 2014-07-21 MED ORDER — CIPROFLOXACIN HCL 500 MG PO TABS: 500.0000 mg | ORAL_TABLET | Freq: Two times a day (BID) | ORAL | Status: DC

## 2014-07-21 MED ORDER — PHENAZOPYRIDINE HCL 200 MG PO TABS: 200.0000 mg | ORAL_TABLET | Freq: Three times a day (TID) | ORAL | Status: DC | PRN

## 2014-07-21 NOTE — Progress Notes (Signed)
[facility] [address], [place] [address] [phone]  Date:  07/21/2014   Name:  Michele Galvan   DOB:  02/21/1986   MRN:  [id number]  PCP:  No PCP Per Patient    Chief Complaint: Dysuria   History of Present Illness:  Michele Galvan is a 28 y.o. very pleasant female patient who presents with the following:  Sudden onset dysuria and urgency and frequency started yesterday. No GI or GYN symptoms No cough or coryza.  Some back pain no abdominal pain   Patient Active Problem List   Diagnosis Date Noted  . NSVD (normal spontaneous vaginal delivery) 11/30/2013    Past Medical History  Diagnosis Date  . Hepatitis     Hep B  . 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 No past surgeries      History  Substance Use Topics  . Smoking status: Never Smoker   . Smokeless tobacco: Never Used  . Alcohol Use: Yes     Comment: prior to pregnancy    Family History  Problem Relation Age of Onset  . Anemia Mother     No Known Allergies  Medication list has been reviewed and updated.  Current Outpatient Prescriptions on File Prior to Visit  Medication Sig Dispense Refill  . tenofovir (VIREAD) 300 MG tablet Take 300 mg by mouth at bedtime.     No current facility-administered medications on file prior to visit.    Review of Systems:  As per HPI, otherwise negative.    Physical Examination: Filed Vitals:   07/21/14 0912  BP: 110/62  Pulse: 74  Temp: 98.1 F (36.7 C)  Resp: 16   Filed Vitals:   07/21/14 0912  Height: 5\' 2"  (1.575 m)  Weight: 118 lb 3.2 oz (53.615 kg)   Body mass index is 21.61 kg/(m^2). Ideal Body Weight: Weight in (lb) to have BMI = 25: 136.4   GEN: WDWN, NAD, Non-toxic, Alert & Oriented x 3 HEENT: Atraumatic, Normocephalic.  Ears and Nose: No external deformity. EXTR: No clubbing/cyanosis/edema NEURO: Normal gait.  PSYCH:  Normally interactive. Conversant. Not depressed or anxious appearing.  Calm demeanor.  Abdomen soft not tender CVA no tenderness   Assessment and Plan: Cystitis cipro pyridum  Signed,  Phillips OdorJeffery Anderson, MD    Results for orders placed or performed in visit on 07/21/14  POCT UA - Microscopic Only  Result Value Ref Range   WBC, Ur, HPF, POC 6-8    RBC, urine, microscopic 0-1    Bacteria, U Microscopic trace    Mucus, UA neg    Epithelial cells, urine per micros 0-1    Crystals, Ur, HPF, POC neg    Casts, Ur, LPF, POC neg    Yeast, UA neg   POCT urinalysis dipstick  Result Value Ref Range   Color, UA pale yellow    Clarity, UA clear    Glucose, UA neg    Bilirubin, UA neg    Ketones, UA neg    Spec Grav, UA <=1.005    Blood, UA small    pH, UA 7.0    Protein, UA neg    Urobilinogen, UA 0.2    Nitrite, UA neg    Leukocytes, UA moderate (2+)

## 2014-07-21 NOTE — Patient Instructions (Signed)

## 2014-07-29 NOTE — L&D Delivery Note (Signed)
Patient is 29 y.o. [id number] [redacted]w[redacted]d admitted for SOL, hx of Chronic Hep B. Fetus with unstable lie, was transverse, breech, and after version was cephalic. Had particulate meconium.    Delivery Note At 10:59 PM a viable female was delivered via Vaginal, Spontaneous Delivery (Presentation: Right Occiput Anterior).  APGAR: 7, 9; weight  .   Placenta status: Intact, Spontaneous.  Did have trailing membranes but none retained. Cord: 3 vessels with the following complications: None.    Anesthesia: Epidural  Episiotomy: None Lacerations: None Suture Repair: n/a Est. Blood Loss (mL):    Mom to postpartum.  Baby to Couplet care / Skin to Skin.  Durenda Hurt 04/22/2015, 11:53 PM  I was gloved and present for entire delivery Agree with note No difficulty with shoulders Baby delivered in one contraction with about 2 pushes Perineum intact Aviva Signs, CNM

## 2014-10-10 ENCOUNTER — Other Ambulatory Visit (HOSPITAL_COMMUNITY): Payer: Self-pay | Admitting: Physician Assistant

## 2014-10-10 DIAGNOSIS — Z3682 Encounter for antenatal screening for nuchal translucency: Secondary | ICD-10-CM

## 2014-10-10 LAB — OB RESULTS CONSOLE GC/CHLAMYDIA
Chlamydia: NEGATIVE
Gonorrhea: NEGATIVE

## 2014-10-10 LAB — OB RESULTS CONSOLE HEPATITIS B SURFACE ANTIGEN: Hepatitis B Surface Ag: POSITIVE

## 2014-10-10 LAB — OB RESULTS CONSOLE ABO/RH: RH TYPE: POSITIVE

## 2014-10-10 LAB — OB RESULTS CONSOLE RUBELLA ANTIBODY, IGM: RUBELLA: IMMUNE

## 2014-10-10 LAB — OB RESULTS CONSOLE RPR: RPR: NONREACTIVE

## 2014-10-10 LAB — OB RESULTS CONSOLE HIV ANTIBODY (ROUTINE TESTING): HIV: NONREACTIVE

## 2014-11-04 ENCOUNTER — Encounter (HOSPITAL_COMMUNITY): Payer: Self-pay

## 2014-11-04 ENCOUNTER — Ambulatory Visit (HOSPITAL_COMMUNITY)
Admission: RE | Admit: 2014-11-04 | Discharge: 2014-11-04 | Disposition: A | Discharge status: Home / Self Care | Payer: 59 | Source: Ambulatory Visit | Attending: Physician Assistant | Admitting: Physician Assistant

## 2014-11-04 DIAGNOSIS — Z36 Encounter for antenatal screening of mother: Secondary | ICD-10-CM | POA: Insufficient documentation

## 2014-11-04 DIAGNOSIS — Z3682 Encounter for antenatal screening for nuchal translucency: Secondary | ICD-10-CM

## 2014-11-04 DIAGNOSIS — Z3A12 12 weeks gestation of pregnancy: Secondary | ICD-10-CM | POA: Insufficient documentation

## 2014-11-16 ENCOUNTER — Other Ambulatory Visit (HOSPITAL_COMMUNITY): Payer: Self-pay | Admitting: Physician Assistant

## 2014-11-16 DIAGNOSIS — Z3689 Encounter for other specified antenatal screening: Secondary | ICD-10-CM

## 2014-11-21 ENCOUNTER — Other Ambulatory Visit (HOSPITAL_COMMUNITY): Payer: Self-pay | Admitting: Physician Assistant

## 2014-12-21 ENCOUNTER — Ambulatory Visit (HOSPITAL_COMMUNITY): Payer: 59

## 2014-12-23 ENCOUNTER — Ambulatory Visit (HOSPITAL_COMMUNITY)
Admission: RE | Admit: 2014-12-23 | Discharge: 2014-12-23 | Disposition: A | Discharge status: Home / Self Care | Payer: 59 | Source: Ambulatory Visit | Attending: Physician Assistant | Admitting: Physician Assistant

## 2014-12-23 DIAGNOSIS — Z3A19 19 weeks gestation of pregnancy: Secondary | ICD-10-CM | POA: Insufficient documentation

## 2014-12-23 DIAGNOSIS — Z36 Encounter for antenatal screening of mother: Secondary | ICD-10-CM | POA: Insufficient documentation

## 2014-12-23 DIAGNOSIS — Z3689 Encounter for other specified antenatal screening: Secondary | ICD-10-CM

## 2015-02-02 ENCOUNTER — Ambulatory Visit (INDEPENDENT_AMBULATORY_CARE_PROVIDER_SITE_OTHER): Payer: 59 | Admitting: Emergency Medicine

## 2015-02-02 VITALS — BP 104/68 | HR 80 | Temp 98.2°F | Resp 18 | Ht 62.0 in | Wt 135.6 lb

## 2015-02-02 DIAGNOSIS — B9689 Other specified bacterial agents as the cause of diseases classified elsewhere: Secondary | ICD-10-CM

## 2015-02-02 DIAGNOSIS — M545 Low back pain, unspecified: Secondary | ICD-10-CM

## 2015-02-02 DIAGNOSIS — N898 Other specified noninflammatory disorders of vagina: Secondary | ICD-10-CM

## 2015-02-02 DIAGNOSIS — A499 Bacterial infection, unspecified: Secondary | ICD-10-CM

## 2015-02-02 DIAGNOSIS — R300 Dysuria: Secondary | ICD-10-CM | POA: Diagnosis not present

## 2015-02-02 DIAGNOSIS — N760 Acute vaginitis: Secondary | ICD-10-CM | POA: Diagnosis not present

## 2015-02-02 LAB — POCT WET PREP WITH KOH
Clue Cells Wet Prep HPF POC: 30
KOH PREP POC: NEGATIVE
Trichomonas, UA: NEGATIVE
YEAST WET PREP PER HPF POC: NEGATIVE

## 2015-02-02 LAB — POCT UA - MICROSCOPIC ONLY
CRYSTALS, UR, HPF, POC: NEGATIVE
Casts, Ur, LPF, POC: NEGATIVE
MUCUS UA: NEGATIVE
Yeast, UA: NEGATIVE

## 2015-02-02 LAB — POCT URINALYSIS DIPSTICK
BILIRUBIN UA: NEGATIVE
Blood, UA: NEGATIVE
GLUCOSE UA: NEGATIVE
Ketones, UA: NEGATIVE
NITRITE UA: NEGATIVE
PH UA: 7
Protein, UA: NEGATIVE
Spec Grav, UA: 1.01
Urobilinogen, UA: 0.2

## 2015-02-02 LAB — CBC
HCT: 37.9 % (ref 36.0–46.0)
HEMOGLOBIN: 12.8 g/dL (ref 12.0–15.0)
MCH: 29.2 pg (ref 26.0–34.0)
MCHC: 33.8 g/dL (ref 30.0–36.0)
MCV: 86.5 fL (ref 78.0–100.0)
MPV: 10.5 fL (ref 8.6–12.4)
PLATELETS: 280 10*3/uL (ref 150–400)
RBC: 4.38 MIL/uL (ref 3.87–5.11)
RDW: 13.9 % (ref 11.5–15.5)
WBC: 12.5 10*3/uL — AB (ref 4.0–10.5)

## 2015-02-02 MED ORDER — METRONIDAZOLE 500 MG PO TABS: 500.0000 mg | ORAL_TABLET | Freq: Two times a day (BID) | ORAL | Status: AC

## 2015-02-02 NOTE — Patient Instructions (Signed)
Take metronidazole twice a day for 7 days. Avoid scented products. I will call you with your lab results. If you symptoms are not improving in 5-7 days, return to clinic. Drink plenty of water - 64 oz per day.

## 2015-02-02 NOTE — Progress Notes (Signed)
[facility] [address], [place] [address] [phone]  Date:  02/02/2015   Name:  Michele [name]   DOB:  04/03/1986   MRN:  [id number]  PCP:  No PCP Per Patient    Chief Complaint: Urinary Tract Infection; Vaginitis; and Back Pain   History of Present Illness:  This is a 29 y.o. 5926 week pregnant female with PMH chronic hepatitis B who is presenting with dysuria, urinary frequency, bilateral lower back pain and vaginal discharge x 3 days. Having some mild lower abdominal pain that started today. Vaginal discharge is thin with odor that smells like "vinegar". Some vaginal itching surrounding the introitus. Denies fever, chills, malaise, n/v. She tried vagisil for suspected yeast infection but no help. She has never had BV before. Pt reports her symptoms began after swimming in a pool. She is sexually active with her husband but has not been sexually active in a couple weeks. This is her third pregnancy. She reports she uses dove unscented on her body. She does not use any scented products on/near her genitals. She does not stick soap or any other products into her vagina.   Review of Systems:  Review of Systems See HPI  Patient Active Problem List   Diagnosis Date Noted  . Encounter for fetal anatomic survey   . [redacted] weeks gestation of pregnancy   . Encounter for (NT) nuchal translucency scan   . [redacted] weeks gestation of pregnancy   . NSVD (normal spontaneous vaginal delivery) 11/30/2013  . Chronic hepatitis B 09/09/2013    Prior to Admission medications   Medication Sig Start Date End Date Taking? Authorizing Provider  Prenatal Vit-Fe Fumarate-FA (PRENATAL VITAMIN PO) Take by mouth.   Yes Historical Provider, MD  tenofovir (VIREAD) 300 MG tablet Take 300 mg by mouth at bedtime.   Yes Historical Provider, MD                  Allergies  Allergen Reactions  . Latex Itching    Past Surgical History  Procedure Laterality Date  . No past surgeries       History  Substance Use Topics  . Smoking status: Never Smoker   . Smokeless tobacco: Never Used  . Alcohol Use: Yes     Comment: prior to pregnancy    Family History  Problem Relation Age of Onset  . Anemia Mother     Medication list has been reviewed and updated.  Physical Examination:  Physical Exam  Constitutional: She is oriented to person, place, and time. She appears well-developed and well-nourished. No distress.  HENT:  Head: Normocephalic and atraumatic.  Right Ear: Hearing normal.  Left Ear: Hearing normal.  Nose: Nose normal.  Eyes: Conjunctivae and lids are normal. Right eye exhibits no discharge. Left eye exhibits no discharge. No scleral icterus.  Cardiovascular: Normal rate, regular rhythm, normal heart sounds and normal pulses.   No murmur heard. Pulmonary/Chest: Effort normal and breath sounds normal. No respiratory distress. She has no wheezes. She has no rhonchi. She has no rales.  Abdominal: Soft. Normal appearance. There is no tenderness. There is no rebound, no guarding and no CVA tenderness.  Mild bilateral flank tenderness. No suprapubic tenderness  Genitourinary: There is no lesion on the right labia. There is no lesion on the left labia. Uterus is enlarged. Cervix exhibits no motion tenderness, no discharge and no friability. Right adnexum displays tenderness (mild discomfort). Left adnexum displays no tenderness. No tenderness in the  vagina. Vaginal discharge (thin white/yellow) found.  Musculoskeletal: Normal range of motion.  Lymphadenopathy:       Head (right side): No submental, no submandibular and no tonsillar adenopathy present.       Head (left side): No submental, no submandibular and no tonsillar adenopathy present.    She has no cervical adenopathy.  Neurological: She is alert and oriented to person, place, and time.  Skin: Skin is warm, dry and intact. No lesion and no rash noted.  Psychiatric: She has a normal mood and affect. Her  speech is normal and behavior is normal. Thought content normal.   BP 104/68 mmHg  Pulse 80  Temp(Src) 98.2 F (36.8 C) (Oral)  Resp 18  Ht  (1.575 m)  Wt 135 lb 9.6 oz (61.508 kg)  BMI 24.80 kg/m2  SpO2 99%  LMP 08/07/2014  Results for orders placed or performed in visit on 02/02/15  POCT UA - Microscopic Only  Result Value Ref Range   WBC, Ur, HPF, POC 1-2    RBC, urine, microscopic 0-1    Bacteria, U Microscopic large    Mucus, UA neg    Epithelial cells, urine per micros 1-2    Crystals, Ur, HPF, POC neg    Casts, Ur, LPF, POC neg    Yeast, UA neg   POCT urinalysis dipstick  Result Value Ref Range   Color, UA yellow    Clarity, UA clear    Glucose, UA neg    Bilirubin, UA neg    Ketones, UA neg    Spec Grav, UA 1.010    Blood, UA neg    pH, UA 7.0    Protein, UA neg    Urobilinogen, UA 0.2    Nitrite, UA neg    Leukocytes, UA Trace (A) Negative  POCT Wet Prep with KOH  Result Value Ref Range   Trichomonas, UA Negative    Clue Cells Wet Prep HPF POC 30%    Epithelial Wet Prep HPF POC Few Few, Moderate, Many   Yeast Wet Prep HPF POC neg    Bacteria Wet Prep HPF POC Many (A) Few   RBC Wet Prep HPF POC 0-1    WBC Wet Prep HPF POC 0-2    KOH Prep POC Negative     Assessment and Plan:  1. Bacterial vaginosis 2. Dysuria 3. Vaginal discharge 4. Bilateral low back pain without sciatica 30% clues on wet prep and classic discharge for BV - will treat with metronidazole BID x 7 days. UA negative except for trace leuks. Urine culture pending. G/C and CBC pending. If symptoms worsen at any time, return. Otherwise return in 1 week if symptoms have not resolved. Counseled on BV prevention - likely triggered by swimming in a pool. - metroNIDAZOLE (FLAGYL) 500 MG tablet; Take 1 tablet (500 mg total) by mouth 2 (two) times daily.  Dispense: 14 tablet; Refill: 0 - POCT UA - Microscopic Only - POCT urinalysis dipstick - Urine culture - POCT Wet Prep with KOH -  GC/Chlamydia Probe Amp - CBC   Nicole V. Dyke Brackett, MHS [facility]  02/03/2015

## 2015-02-03 ENCOUNTER — Other Ambulatory Visit (HOSPITAL_COMMUNITY): Payer: Self-pay | Admitting: Physician Assistant

## 2015-02-03 DIAGNOSIS — Z0489 Encounter for examination and observation for other specified reasons: Secondary | ICD-10-CM

## 2015-02-03 DIAGNOSIS — IMO0002 Reserved for concepts with insufficient information to code with codable children: Secondary | ICD-10-CM

## 2015-02-03 LAB — GC/CHLAMYDIA PROBE AMP
CT Probe RNA: NEGATIVE
GC PROBE AMP APTIMA: NEGATIVE

## 2015-02-03 LAB — URINE CULTURE
Colony Count: NO GROWTH
ORGANISM ID, BACTERIA: NO GROWTH

## 2015-02-05 NOTE — Progress Notes (Signed)
  Medical screening examination/treatment/procedure(s) were performed by non-physician practitioner and as supervising physician I was immediately available for consultation/collaboration.     

## 2015-02-07 ENCOUNTER — Ambulatory Visit (HOSPITAL_COMMUNITY): Payer: 59

## 2015-02-09 ENCOUNTER — Ambulatory Visit (HOSPITAL_COMMUNITY)
Admission: RE | Admit: 2015-02-09 | Discharge: 2015-02-09 | Disposition: A | Discharge status: Home / Self Care | Payer: 59 | Source: Ambulatory Visit | Attending: Physician Assistant | Admitting: Physician Assistant

## 2015-02-09 DIAGNOSIS — Z3A26 26 weeks gestation of pregnancy: Secondary | ICD-10-CM | POA: Insufficient documentation

## 2015-02-09 DIAGNOSIS — Z36 Encounter for antenatal screening of mother: Secondary | ICD-10-CM | POA: Insufficient documentation

## 2015-02-09 DIAGNOSIS — Z0489 Encounter for examination and observation for other specified reasons: Secondary | ICD-10-CM | POA: Insufficient documentation

## 2015-02-09 DIAGNOSIS — IMO0002 Reserved for concepts with insufficient information to code with codable children: Secondary | ICD-10-CM

## 2015-03-17 ENCOUNTER — Inpatient Hospital Stay (HOSPITAL_COMMUNITY)
Admission: AD | Admit: 2015-03-17 | Discharge: 2015-03-17 | Disposition: A | Discharge status: Home / Self Care | Payer: 59 | Source: Ambulatory Visit | Attending: Family Medicine | Admitting: Family Medicine

## 2015-03-17 ENCOUNTER — Encounter (HOSPITAL_COMMUNITY): Payer: Self-pay | Admitting: *Deleted

## 2015-03-17 DIAGNOSIS — M545 Low back pain, unspecified: Secondary | ICD-10-CM

## 2015-03-17 DIAGNOSIS — O26893 Other specified pregnancy related conditions, third trimester: Secondary | ICD-10-CM | POA: Insufficient documentation

## 2015-03-17 DIAGNOSIS — O479 False labor, unspecified: Secondary | ICD-10-CM | POA: Insufficient documentation

## 2015-03-17 DIAGNOSIS — Z3A31 31 weeks gestation of pregnancy: Secondary | ICD-10-CM | POA: Insufficient documentation

## 2015-03-17 DIAGNOSIS — O4703 False labor before 37 completed weeks of gestation, third trimester: Secondary | ICD-10-CM

## 2015-03-17 LAB — URINALYSIS, ROUTINE W REFLEX MICROSCOPIC
BILIRUBIN URINE: NEGATIVE
Glucose, UA: NEGATIVE mg/dL
Hgb urine dipstick: NEGATIVE
Ketones, ur: NEGATIVE mg/dL
Nitrite: NEGATIVE
PH: 7 (ref 5.0–8.0)
Protein, ur: NEGATIVE mg/dL
SPECIFIC GRAVITY, URINE: 1.015 (ref 1.005–1.030)
Urobilinogen, UA: 0.2 mg/dL (ref 0.0–1.0)

## 2015-03-17 LAB — URINE MICROSCOPIC-ADD ON

## 2015-03-17 MED ORDER — CYCLOBENZAPRINE HCL 5 MG PO TABS: 5.0000 mg | ORAL_TABLET | Freq: Three times a day (TID) | ORAL | Status: DC | PRN
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
## 2015-03-17 NOTE — Discharge Instructions (Signed)
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
## 2015-04-22 ENCOUNTER — Encounter (HOSPITAL_COMMUNITY): Payer: Self-pay

## 2015-04-22 ENCOUNTER — Inpatient Hospital Stay (HOSPITAL_COMMUNITY): Payer: Managed Care, Other (non HMO) | Admitting: Anesthesiology

## 2015-04-22 ENCOUNTER — Inpatient Hospital Stay (HOSPITAL_COMMUNITY)
Admission: AD | Admit: 2015-04-22 | Discharge: 2015-04-24 | DRG: 774 | Disposition: A | Discharge status: Home / Self Care | Payer: Managed Care, Other (non HMO) | Source: Ambulatory Visit | Attending: Obstetrics and Gynecology | Admitting: Obstetrics and Gynecology

## 2015-04-22 DIAGNOSIS — O99345 Other mental disorders complicating the puerperium: Secondary | ICD-10-CM

## 2015-04-22 DIAGNOSIS — O326XX0 Maternal care for compound presentation, not applicable or unspecified: Secondary | ICD-10-CM | POA: Diagnosis present

## 2015-04-22 DIAGNOSIS — F53 Postpartum depression: Secondary | ICD-10-CM

## 2015-04-22 DIAGNOSIS — O329XX0 Maternal care for malpresentation of fetus, unspecified, not applicable or unspecified: Secondary | ICD-10-CM

## 2015-04-22 DIAGNOSIS — B951 Streptococcus, group B, as the cause of diseases classified elsewhere: Secondary | ICD-10-CM | POA: Diagnosis present

## 2015-04-22 DIAGNOSIS — O9842 Viral hepatitis complicating childbirth: Secondary | ICD-10-CM | POA: Diagnosis present

## 2015-04-22 DIAGNOSIS — B181 Chronic viral hepatitis B without delta-agent: Secondary | ICD-10-CM | POA: Diagnosis present

## 2015-04-22 DIAGNOSIS — Z3A36 36 weeks gestation of pregnancy: Secondary | ICD-10-CM

## 2015-04-22 HISTORY — DX: Postpartum depression: F53.0

## 2015-04-22 HISTORY — DX: Other mental disorders complicating the puerperium: O99.345

## 2015-04-22 HISTORY — DX: Maternal care for malpresentation of fetus, unspecified, not applicable or unspecified: O32.9XX0

## 2015-04-22 LAB — CBC
HEMATOCRIT: 39.8 % (ref 36.0–46.0)
Hemoglobin: 13.7 g/dL (ref 12.0–15.0)
MCH: 29.1 pg (ref 26.0–34.0)
MCHC: 34.4 g/dL (ref 30.0–36.0)
MCV: 84.7 fL (ref 78.0–100.0)
Platelets: 255 10*3/uL (ref 150–400)
RBC: 4.7 MIL/uL (ref 3.87–5.11)
RDW: 13.4 % (ref 11.5–15.5)
WBC: 10.3 10*3/uL (ref 4.0–10.5)

## 2015-04-22 LAB — OB RESULTS CONSOLE GBS: STREP GROUP B AG: NEGATIVE

## 2015-04-22 LAB — GROUP B STREP BY PCR: Group B strep by PCR: NEGATIVE

## 2015-04-22 LAB — TYPE AND SCREEN
ABO/RH(D): O POS
Antibody Screen: NEGATIVE

## 2015-04-22 MED ORDER — FENTANYL 2.5 MCG/ML BUPIVACAINE 1/10 % EPIDURAL INFUSION (WH - ANES)
14.0000 mL/h | INTRAMUSCULAR | Status: DC | PRN
  Filled 2015-04-22: qty 125

## 2015-04-22 MED ORDER — ONDANSETRON HCL 4 MG/2ML IJ SOLN: 4.0000 mg | Freq: Four times a day (QID) | INTRAMUSCULAR | Status: DC | PRN

## 2015-04-22 MED ORDER — TERBUTALINE SULFATE 1 MG/ML IJ SOLN
INTRAMUSCULAR | Status: AC
  Filled 2015-04-22: qty 1

## 2015-04-22 MED ORDER — LIDOCAINE HCL (PF) 1 % IJ SOLN
INTRAMUSCULAR | Status: DC | PRN
  Administered 2015-04-22: 6 mL via EPIDURAL
  Administered 2015-04-22: 4 mL

## 2015-04-22 MED ORDER — OXYTOCIN BOLUS FROM INFUSION: 500.0000 mL | INTRAVENOUS | Status: DC

## 2015-04-22 MED ORDER — LACTATED RINGERS IV SOLN
INTRAVENOUS | Status: DC
  Administered 2015-04-22: 19:00:00 via INTRAUTERINE

## 2015-04-22 MED ORDER — TENOFOVIR DISOPROXIL FUMARATE 300 MG PO TABS
300.0000 mg | ORAL_TABLET | Freq: Every day | ORAL | Status: DC
  Filled 2015-04-22: qty 1

## 2015-04-22 MED ORDER — OXYCODONE-ACETAMINOPHEN 5-325 MG PO TABS: 1.0000 | ORAL_TABLET | ORAL | Status: DC | PRN

## 2015-04-22 MED ORDER — LACTATED RINGERS IV SOLN
INTRAVENOUS | Status: DC
  Administered 2015-04-22: 16:00:00 via INTRAVENOUS

## 2015-04-22 MED ORDER — SODIUM CHLORIDE 0.9 % IV SOLN
2.0000 g | Freq: Once | INTRAVENOUS | Status: AC
  Administered 2015-04-22: 2 g via INTRAVENOUS
  Filled 2015-04-22: qty 2000

## 2015-04-22 MED ORDER — OXYTOCIN 10 UNIT/ML IJ SOLN
INTRAMUSCULAR | Status: AC
  Filled 2015-04-22: qty 2

## 2015-04-22 MED ORDER — DIPHENHYDRAMINE HCL 50 MG/ML IJ SOLN: 12.5000 mg | INTRAMUSCULAR | Status: DC | PRN

## 2015-04-22 MED ORDER — EPHEDRINE 5 MG/ML INJ
10.0000 mg | INTRAVENOUS | Status: DC | PRN
  Filled 2015-04-22: qty 2

## 2015-04-22 MED ORDER — FENTANYL 2.5 MCG/ML BUPIVACAINE 1/10 % EPIDURAL INFUSION (WH - ANES)
14.0000 mL/h | INTRAMUSCULAR | Status: DC | PRN
Start: 2015-04-22 — End: 2015-04-23
  Administered 2015-04-22: 14 mL/h via EPIDURAL

## 2015-04-22 MED ORDER — CITRIC ACID-SODIUM CITRATE 334-500 MG/5ML PO SOLN
30.0000 mL | ORAL | Status: DC | PRN
  Filled 2015-04-22: qty 15

## 2015-04-22 MED ORDER — LIDOCAINE HCL (PF) 1 % IJ SOLN
30.0000 mL | INTRAMUSCULAR | Status: DC | PRN
  Filled 2015-04-22: qty 30

## 2015-04-22 MED ORDER — TERBUTALINE SULFATE 1 MG/ML IJ SOLN
0.2500 mg | Freq: Once | INTRAMUSCULAR | Status: DC | PRN
  Filled 2015-04-22: qty 1

## 2015-04-22 MED ORDER — TERBUTALINE SULFATE 1 MG/ML IJ SOLN
0.2500 mg | Freq: Once | INTRAMUSCULAR | Status: AC
  Administered 2015-04-22: 0.25 mg via SUBCUTANEOUS

## 2015-04-22 MED ORDER — OXYTOCIN 40 UNITS IN LACTATED RINGERS INFUSION - SIMPLE MED
62.5000 mL/h | INTRAVENOUS | Status: DC
  Filled 2015-04-22: qty 1000

## 2015-04-22 MED ORDER — PHENYLEPHRINE 40 MCG/ML (10ML) SYRINGE FOR IV PUSH (FOR BLOOD PRESSURE SUPPORT)
80.0000 ug | PREFILLED_SYRINGE | INTRAVENOUS | Status: DC | PRN
  Filled 2015-04-22: qty 2
  Filled 2015-04-22: qty 20

## 2015-04-22 MED ORDER — OXYTOCIN 40 UNITS IN LACTATED RINGERS INFUSION - SIMPLE MED
1.0000 m[IU]/min | INTRAVENOUS | Status: DC
Start: 2015-04-22 — End: 2015-04-23

## 2015-04-22 MED ORDER — ACETAMINOPHEN 325 MG PO TABS: 650.0000 mg | ORAL_TABLET | ORAL | Status: DC | PRN

## 2015-04-22 MED ORDER — LACTATED RINGERS IV SOLN
500.0000 mL | INTRAVENOUS | Status: DC | PRN
  Administered 2015-04-22 (×4): 500 mL via INTRAVENOUS

## 2015-04-22 MED ORDER — OXYCODONE-ACETAMINOPHEN 5-325 MG PO TABS: 2.0000 | ORAL_TABLET | ORAL | Status: DC | PRN

## 2015-04-22 NOTE — Progress Notes (Signed)
Called by RN for thick meconium and patient feeling vaginal pressure. She was checked and found to be 9cm, with descent. Infant is vertex with hand by head (persisent) but per RN improved application of fetal head. Bulging forebag present.   I reviewed strip which is significant for deep variables with return to baseline that improved with position changes.  Given variability and return to baseline I am overall reassuring. Presence of meconium will necessitate NICU at delivery although I suspect this is related to fetal stress from ECV.   Federico Flake, MD

## 2015-04-22 NOTE — Procedures (Signed)
After informed verbal and written consent, Terbutaline 0.25 mg SQ given, ECV was attempted under Ultrasound guidance.   FHR was reactive before and after the procedure.   Pt. Tolerated the procedure well. Fetus was confirmed vertex at the end of the procedure and an abdominal binder was placed. Again [place] confirmed vertex after binder placed, though it should be noted the fetus had a hand in front of the head.  Federico Flake, MD

## 2015-04-22 NOTE — Progress Notes (Signed)
Patient ID: Delesa Kawa, female   DOB: 26-Dec-1985, 29 y.o.   MRN: [id number] External Cephalic Version  Preprocedure Diagnosis:  29 yo  G4P2012 at 36.[redacted] weeks gestational age with fetal breech presentation in labor  Post-procedure Diagnosis: 39 G4P2012 at 32.[redacted] weeks gestational age with fetus in vertex presentation  Procedure:  External cephalic version  Procedure in detail:   The patient was brought to Labor and Delivery where a reactive fetal heart tracing was obtained. She was noted to be in transverse position with head to maternal right followed by breech presentation. The patient desired to avoid a c-section if possible. She was counseled on risks and benefits of external cephalic version and primary cesarean section secondary to fetal malpresentation. The patient desired external cephalic version. The patient was noted to have irregular contractions, every 2- 5 minutes. She was given 1 dose of subcutaneous terbutaline which resolved her contraction. A bedside ultrasound was performed which revealed single intrauterine pregnancy and breech presentation. There was noted to be adequate fluid. Using manual pressure, the breech was manipulated in a forward roll fashion until a vertex presentation was obtained. Fetal heart tones were checked intermittently during the procedure and were noted to be reassuring. Following successful external cephalic version, the patient was placed on continuous external fetal monitoring. An abdominal binder was ordered.

## 2015-04-22 NOTE — H&P (Signed)
OBSTETRIC ADMISSION HISTORY AND PHYSICAL  Shaianne Nucci is a 29 y.o. female [phone] with IUP at [redacted]w[redacted]d by L/13 presenting for preterm active labor.  Presenting with regular contractions starting at 10 AM. She reports +FMs, No LOF, no VB, no blurry vision, headaches or peripheral edema, and RUQ pain.  She plans on breast feeding. She is undecided but thinking about IUD  Dating: By L/13 --->  Estimated Date of Delivery: 05/14/15  Sono:   - FIRST screen ,normal anatomy  Prenatal History/Complications:  Past Medical History: Past Medical History  Diagnosis Date  . Hepatitis     Hep B  . Depression     post-partum depression after TAB 05/2007   Past Surgical History: Past Surgical History  Procedure Laterality Date  . No past surgeries      Obstetrical History: OB History    Gravida Para Term Preterm AB TAB SAB Ectopic Multiple Living   Social History: Social History   Social History  . Marital Status: Married    Spouse Name: N/A  . Number of Children: N/A  . Years of Education: N/A   Social History Main Topics  . Smoking status: Never Smoker   . Smokeless tobacco: Never Used  . Alcohol Use: Yes     Comment: prior to pregnancy; socially   . Drug Use: No  . Sexual Activity: Yes    Birth Control/ Protection: None   Other Topics Concern  . None   Social History Narrative   ** Merged History Encounter **        Family History: Family History  Problem Relation Age of Onset  . Anemia Mother     Allergies: Allergies  Allergen Reactions  . Latex Itching    Prescriptions prior to admission  Medication Sig Dispense Refill Last Dose  . acetaminophen (TYLENOL) 325 MG tablet Take 650 mg by mouth every 6 (six) hours as needed for mild pain or headache.   03/16/2015 at Unknown time  . cyclobenzaprine (FLEXERIL) 5 MG tablet Take 1 tablet (5 mg total) by mouth 3 (three) times daily as needed for muscle spasms. 15 tablet 0   .  Prenatal Vit-Fe Fumarate-FA (PRENATAL VITAMIN PO) Take by mouth.   03/16/2015 at Unknown time  . tenofovir (VIREAD) 300 MG tablet Take 300 mg by mouth at bedtime.   03/16/2015 at Unknown time     Review of Systems   All systems reviewed and negative except as stated in HPI  Blood pressure 125/75, pulse 75, temperature 97.5 F (36.4 C), temperature source Oral, resp. rate 18, height  (1.575 m), weight 146 lb (66.225 kg), last menstrual period 08/07/2014, not currently breastfeeding. General appearance: alert, cooperative and appears stated age Lungs: clear to auscultation bilaterally Heart: regular rate and rhythm Abdomen: soft, non-tender; bowel sounds normal Pelvic: adequate Extremities: Homans sign is negative, no sign of DVT  Presentation: unsure- No presenting part felt.  Fetal monitoringBaseline: 140 bpm, Variability: Good {> 6 bpm) and Accelerations: Reactive Uterine activityFrequency: Every 2-3 minutes Dilation: 8 Effacement (%): 80 Station: -1 Exam by:: Newton  Prenatal labs: ABO, Rh: O/Positive/-- (03/14 0000) Antibody:   Rubella:   RPR: Nonreactive (03/14 0000)  HBsAg: Positive (03/14 0000)  HIV: Non-reactive (03/14 0000)  GBS:    1 hr Glucola 101 Genetic screening  FIRST neg Anatomy US wnl  Prenatal Transfer Tool  Maternal Diabetes: No Genetic Screening: Normal- FIRST screen  Maternal Ultrasounds/Referrals: Normal Fetal Ultrasounds or other Referrals:  None Maternal Substance Abuse:  No Significant Maternal Medications:  Meds include: Other: Tenofivir Significant Maternal Lab Results: Lab values include: HBsAG positive, Other: GBS UNKNOWN  Results for orders placed or performed during the hospital encounter of 04/22/15 (from the past 24 hour(s))  Group B strep by PCR   Collection Time: 04/22/15  3:35 PM  Result Value Ref Range   Group B strep by PCR NEGATIVE NEGATIVE  CBC   Collection Time: 04/22/15  4:00 PM  Result Value Ref Range   WBC 10.3 4.0 -  10.5 K/uL   RBC 4.70 3.87 - 5.11 MIL/uL   Hemoglobin 13.7 12.0 - 15.0 g/dL   HCT 16.1 09.6 - 04.5 %   MCV 84.7 78.0 - 100.0 fL   MCH 29.1 26.0 - 34.0 pg   MCHC 34.4 30.0 - 36.0 g/dL   RDW 40.9 81.1 - 91.4 %   Platelets 255 150 - 400 K/uL    Patient Active Problem List   Diagnosis Date Noted  . Active preterm labor 04/22/2015  . H/O postpartum depression, currently pregnant 04/22/2015  . NSVD (normal spontaneous vaginal delivery) 11/30/2013  . Chronic hepatitis B 09/09/2013    Assessment: Dalyce Renne is a 29 y.o. [id number] at 110w6d here for active preterm labor  #Labor: As there was no presenting part I quickly obtained an [place] which confirmed transverse/obilque lie with head in Left quadrant. Infant was then breech on [place]. I called Dr. Jolayne Panther to the room when I identified malpresentation. Terbutaline now then will attempt ECV as infant was transverse then breech.   #Pain: Desires epidural #FWB: Cat I #ID:  GBS unknown, preterm. Will treat with ampicillin and get GBS PCR #MOF: breast #MOC: Possible IUD #Circ:  Female, desires circ  #Hepatitis B- continue tenofivir. Will get hepatitis panel and immunization for Hep A while in hospital if not immune. Needs hepatology care. Infant will need treatment with IgG and early Hep B immunization  Federico Flake 04/22/2015, 4:40 PM

## 2015-04-22 NOTE — Progress Notes (Signed)
Siona Coulston is a 29 y.o. [phone] at [redacted]w[redacted]d by ultrasound admitted for active labor  Subjective: Comfortable with epidural. Feeling some pressure with contractions  Objective: BP 105/56 mmHg  Pulse 79  Temp(Src) 98 F (36.7 C) (Oral)  Resp 18  Ht  (1.575 m)  Wt 146 lb (66.225 kg)  BMI 26.70 kg/m2  SpO2 99%  LMP 08/07/2014      FHT:  FHR: 145 bpm, variability: moderate,  accelerations:  Present,  decelerations:  Present Variable UC:   regular, every 2 minutes SVE:   Dilation: Lip/rim Effacement (%): 100 Station: 0 Exam by:: C.Okoroji RN-BSN  Labs: Lab Results  Component Value Date   WBC 10.3 04/22/2015   HGB 13.7 04/22/2015   HCT 39.8 04/22/2015   MCV 84.7 04/22/2015   PLT 255 04/22/2015    Assessment / Plan: Spontaneous labor, progressing normally  Labor: Progressing normally Preeclampsia:  n/a Fetal Wellbeing:  Category II Pain Control:  Epidural I/D:  n/a Anticipated MOD:  NSVD  WILLIAMS,MARIE 04/22/2015, 9:08 PM

## 2015-04-22 NOTE — Anesthesia Procedure Notes (Signed)
Epidural Patient location during procedure: OB  Preanesthetic Checklist Completed: patient identified, site marked, surgical consent, pre-op evaluation, timeout performed, IV checked, risks and benefits discussed and monitors and equipment checked  Epidural Patient position: sitting Prep: site prepped and draped and DuraPrep Patient monitoring: continuous pulse ox and blood pressure Approach: midline Location: L3-L4 Injection technique: LOR air  Needle:  Needle type: Tuohy  Needle gauge: 17 G Needle length: 9 cm and 9 Needle insertion depth: 4 cm Catheter type: closed end flexible Catheter size: 19 Gauge Catheter at skin depth: 10 cm Test dose: negative  Assessment Events: blood not aspirated, injection not painful, no injection resistance, negative IV test and no paresthesia  Additional Notes Dosing of Epidural:  1st dose, through catheter .............................................  Xylocaine 40 mg  2nd dose, through catheter, after waiting 3 minutes.........Xylocaine 60 mg    As each dose occurred, patient was free of IV sx; and patient exhibited no evidence of SA injection.  Patient is more comfortable after epidural dosed. Please see RN's note for documentation of vital signs,and FHR which are stable.  Patient reminded not to try to ambulate with numb legs, and that an RN must be present when she attempts to get up.       

## 2015-04-22 NOTE — Anesthesia Preprocedure Evaluation (Signed)
Anesthesia Evaluation  Patient identified by MRN, date of birth, ID band Patient awake    Reviewed: Allergy & Precautions, H&P , Patient's Chart, lab work & pertinent test results  Airway Mallampati: II  TM Distance: >3 FB Neck ROM: full    Dental  (+) Teeth Intact   Pulmonary    breath sounds clear to auscultation       Cardiovascular  Rhythm:regular Rate:Normal     Neuro/Psych    GI/Hepatic (+) Hepatitis -, B  Endo/Other    Renal/GU      Musculoskeletal   Abdominal   Peds  Hematology   Anesthesia Other Findings       Reproductive/Obstetrics (+) Pregnancy                             Anesthesia Physical Anesthesia Plan  ASA: II  Anesthesia Plan: Epidural   Post-op Pain Management:    Induction:   Airway Management Planned:   Additional Equipment:   Intra-op Plan:   Post-operative Plan:   Informed Consent: I have reviewed the patients History and Physical, chart, labs and discussed the procedure including the risks, benefits and alternatives for the proposed anesthesia with the patient or authorized representative who has indicated his/her understanding and acceptance.   Dental Advisory Given  Plan Discussed with:   Anesthesia Plan Comments: (Labs checked- platelets confirmed with RN in room. Fetal heart tracing, per RN, reported to be stable enough for sitting procedure. Discussed epidural, and patient consents to the procedure:  included risk of possible headache,backache, failed block, allergic reaction, and nerve injury. This patient was asked if she had any questions or concerns before the procedure started.)        Anesthesia Quick Evaluation

## 2015-04-22 NOTE — MAU Note (Signed)
Pt reports contraction all morning. Denies SROm or vag bleeding. Good fetal movement reported.

## 2015-04-22 NOTE — Progress Notes (Signed)
Labor Progress Note Michele Galvan is a 28 y.o. [phone] at [redacted]w[redacted]d presented for active labor with unstable lie and breech s/p ECV with compound presentation with arm above head  S: Patient is comfortable after epidural  O:  BP 109/65 mmHg  Pulse 86  Temp(Src) 98.4 F (36.9 C) (Oral)  Resp 18  Ht  (1.575 m)  Wt 146 lb (66.225 kg)  BMI 26.70 kg/m2  SpO2 100%  LMP 08/07/2014 EFM: 150/Mod/no accels, deep variables. Good variability throughout decelerations  CVE: Dilation: Lip/rim Effacement (%): 100 Cervical Position: Anterior Station: +1 Presentation: Vertex Exam by:: Dr Alvester Morin  Patient has edematous anterior lip. I was able to reduce the fetal arm behind the head. The end of the exam and after 2 pushing trials fetal head was nicely applied to the cervix.  I called Dr. Jolayne Panther to the room given prolonged variable and my concern for cord compression but this resolved and IUPC placed.    A&P: 29 y.o. [id number] [redacted]w[redacted]d in spontaneous preterm labor #Labor: expectant management. Patient is consent for C-section. This was performed prior to ECV. #Pain: s/p epidural #FWB: Cat II, reduced compound presentation and placed IUPC, will give amnioinfusion.  #GBS: negative, no need to redose ampicillin  Anticipate SVD  Federico Flake, MD 7:18 PM

## 2015-04-23 ENCOUNTER — Encounter (HOSPITAL_COMMUNITY): Payer: Self-pay | Admitting: Emergency Medicine

## 2015-04-23 LAB — HEPATITIS C ANTIBODY: HCV Ab: 0.1 s/co ratio (ref 0.0–0.9)

## 2015-04-23 LAB — RPR: RPR Ser Ql: NONREACTIVE

## 2015-04-23 LAB — HEPATITIS A ANTIBODY, TOTAL: HEP A TOTAL AB: POSITIVE — AB

## 2015-04-23 LAB — HEPATITIS B CORE ANTIBODY, IGM: Hep B C IgM: NEGATIVE

## 2015-04-23 LAB — HEPATITIS B SURFACE ANTIBODY, QUANTITATIVE

## 2015-04-23 MED ORDER — PRENATAL MULTIVITAMIN CH
1.0000 | ORAL_TABLET | Freq: Every day | ORAL | Status: DC
  Administered 2015-04-23 – 2015-04-24 (×2): 1 via ORAL
  Filled 2015-04-23 (×2): qty 1

## 2015-04-23 MED ORDER — SIMETHICONE 80 MG PO CHEW: 80.0000 mg | CHEWABLE_TABLET | ORAL | Status: DC | PRN

## 2015-04-23 MED ORDER — LANOLIN HYDROUS EX OINT
TOPICAL_OINTMENT | CUTANEOUS | Status: DC | PRN
Start: 2015-04-23 — End: 2015-04-24

## 2015-04-23 MED ORDER — ONDANSETRON HCL 4 MG PO TABS: 4.0000 mg | ORAL_TABLET | ORAL | Status: DC | PRN

## 2015-04-23 MED ORDER — OXYCODONE-ACETAMINOPHEN 5-325 MG PO TABS
1.0000 | ORAL_TABLET | ORAL | Status: DC | PRN
  Administered 2015-04-23 – 2015-04-24 (×2): 1 via ORAL
  Filled 2015-04-23 (×4): qty 1

## 2015-04-23 MED ORDER — TENOFOVIR DISOPROXIL FUMARATE 300 MG PO TABS
300.0000 mg | ORAL_TABLET | Freq: Every day | ORAL | Status: DC
  Administered 2015-04-23: 300 mg via ORAL
  Filled 2015-04-23 (×2): qty 1

## 2015-04-23 MED ORDER — ACETAMINOPHEN 325 MG PO TABS: 650.0000 mg | ORAL_TABLET | ORAL | Status: DC | PRN

## 2015-04-23 MED ORDER — DIPHENHYDRAMINE HCL 25 MG PO CAPS: 25.0000 mg | ORAL_CAPSULE | Freq: Four times a day (QID) | ORAL | Status: DC | PRN

## 2015-04-23 MED ORDER — BENZOCAINE-MENTHOL 20-0.5 % EX AERO: 1.0000 "application " | INHALATION_SPRAY | CUTANEOUS | Status: DC | PRN

## 2015-04-23 MED ORDER — ONDANSETRON HCL 4 MG/2ML IJ SOLN: 4.0000 mg | INTRAMUSCULAR | Status: DC | PRN

## 2015-04-23 MED ORDER — DIBUCAINE 1 % RE OINT: 1.0000 "application " | TOPICAL_OINTMENT | RECTAL | Status: DC | PRN

## 2015-04-23 MED ORDER — IBUPROFEN 600 MG PO TABS
600.0000 mg | ORAL_TABLET | Freq: Four times a day (QID) | ORAL | Status: DC
  Administered 2015-04-23 – 2015-04-24 (×7): 600 mg via ORAL
  Filled 2015-04-23 (×7): qty 1

## 2015-04-23 MED ORDER — WITCH HAZEL-GLYCERIN EX PADS: 1.0000 "application " | MEDICATED_PAD | CUTANEOUS | Status: DC | PRN

## 2015-04-23 MED ORDER — SENNOSIDES-DOCUSATE SODIUM 8.6-50 MG PO TABS
2.0000 | ORAL_TABLET | ORAL | Status: DC
  Administered 2015-04-23 (×2): 2 via ORAL
  Filled 2015-04-23 (×2): qty 2

## 2015-04-23 MED ORDER — ZOLPIDEM TARTRATE 5 MG PO TABS: 5.0000 mg | ORAL_TABLET | Freq: Every evening | ORAL | Status: DC | PRN

## 2015-04-23 NOTE — Lactation Note (Signed)
This note was copied from the chart of Michele Shriya Aker. Lactation Consultation Note  Discussed late preterm feeding behavior. Mother states she has been giving formula because she has no milk. Baby have been waking to feed every 2-2.5 hours.   Provided education.  Encouraged her to breastfeed first then give formula. Left LC phone number for mother to call to check latch. Reviewed volume guidelines and encouarged mother to post pump.   Patient Name: Michele Galvan [id number] Date: 04/23/2015 Reason for consult: Late preterm infant   Maternal Data    Feeding    LATCH Score/Interventions                      Lactation Tools Discussed/Used     Consult Status Consult Status: Follow-up Date: 04/24/15 Follow-up type: In-patient    Dahlia Byes [facility] 04/23/2015, 11:40 AM

## 2015-04-23 NOTE — Progress Notes (Signed)
Post Partum Day 1 Subjective: no complaints, up ad lib, voiding, tolerating PO and + flatus  Objective: Blood pressure 101/61, pulse 61, temperature 98.3 F (36.8 C), temperature source Oral, resp. rate 18, height  (1.575 m), weight 146 lb (66.225 kg), last menstrual period 08/07/2014, SpO2 99 %, unknown if currently breastfeeding.  Physical Exam:  General: alert, cooperative, appears stated age and no distress Lochia: appropriate Uterine Fundus: firm Incision: n/a DVT Evaluation: No evidence of DVT seen on physical exam. Negative Homan's sign. No cords or calf tenderness.   Recent Labs  04/22/15 1600  HGB 13.7  HCT 39.8    Assessment/Plan: Plan for discharge tomorrow   LOS: 1 day   Wyvonnia Dusky DARLENE 04/23/2015, 8:53 AM

## 2015-04-23 NOTE — Anesthesia Postprocedure Evaluation (Signed)
  Anesthesia Post-op Note  Patient: Michele Galvan  Procedure(s) Performed: * No procedures listed *  Patient Location: Mother/Baby  Anesthesia Type:Epidural  Level of Consciousness: awake and alert   Airway and Oxygen Therapy: Patient Spontanous Breathing  Post-op Pain: mild  Post-op Assessment: Post-op Vital signs reviewed, Patient's Cardiovascular Status Stable, Respiratory Function Stable, No signs of Nausea or vomiting, Pain level controlled, No headache, Spinal receding and Patient able to bend at knees              Post-op Vital Signs: Reviewed  Last Vitals:  Filed Vitals:   04/23/15 0638  BP: 101/61  Pulse: 61  Temp: 36.8 C  Resp: 18    Complications: No apparent anesthesia complications

## 2015-04-24 LAB — COMPREHENSIVE METABOLIC PANEL
ALBUMIN: 2.7 g/dL — AB (ref 3.5–5.0)
ALT: 13 U/L — ABNORMAL LOW (ref 14–54)
ANION GAP: 8 (ref 5–15)
AST: 22 U/L (ref 15–41)
Alkaline Phosphatase: 125 U/L (ref 38–126)
BILIRUBIN TOTAL: 0.5 mg/dL (ref 0.3–1.2)
BUN: 14 mg/dL (ref 6–20)
CO2: 24 mmol/L (ref 22–32)
Calcium: 8.7 mg/dL — ABNORMAL LOW (ref 8.9–10.3)
Chloride: 103 mmol/L (ref 101–111)
Creatinine, Ser: 0.73 mg/dL (ref 0.44–1.00)
GFR calc non Af Amer: >60 mL/min (ref >60)
GLUCOSE: 70 mg/dL (ref 65–99)
POTASSIUM: 4.1 mmol/L (ref 3.5–5.1)
SODIUM: 135 mmol/L (ref 135–145)
TOTAL PROTEIN: 6.3 g/dL — AB (ref 6.5–8.1)

## 2015-04-24 MED ORDER — IBUPROFEN 600 MG PO TABS: 600.0000 mg | ORAL_TABLET | Freq: Four times a day (QID) | ORAL | Status: DC | PRN

## 2015-04-24 MED ORDER — IBUPROFEN 600 MG PO TABS: 600.0000 mg | ORAL_TABLET | Freq: Four times a day (QID) | ORAL | Status: DC

## 2015-04-24 MED ORDER — TETANUS-DIPHTH-ACELL PERTUSSIS 5-2.5-18.5 LF-MCG/0.5 IM SUSP
0.5000 mL | Freq: Once | INTRAMUSCULAR | Status: AC
  Administered 2015-04-24: 0.5 mL via INTRAMUSCULAR
  Filled 2015-04-24: qty 0.5

## 2015-04-24 NOTE — Clinical Social Work Maternal (Signed)
  CLINICAL SOCIAL WORK MATERNAL/CHILD NOTE  Patient Details  Name: Michele Galvan MRN: [id number] Date of Birth: 11/20/1985  Date:  04/24/2015  Clinical Social Worker Initiating Note:  Tarin Johndrow LCSW Date/ Time Initiated:  04/24/15/1030     Child's Name:  Michele Galvan   Legal Guardian:  Jose and Anastasha Galvan (parents)   Need for Interpreter:  None   Date of Referral:  04/22/15     Reason for Referral:  History of postpartum depression   Referral Source:  Central Nurs[facility]   Address:  [address] [place], [place] [address]  Phone number:  [phone]   Household Members:  Minor Children (ages 8 and 1), Spouse   Natural Supports (not living in the home):  Immediate Family, Extended Family   Professional Supports: None   Financial Resources:  Medicaid, Private Insurance   Other Resources:    None identified  Cultural/Religious Considerations Which May Impact Care:  None reported  Strengths:  Ability to meet basic needs , Pediatrician chosen , Home prepared for child    Risk Factors/Current Problems:  None   Cognitive State:  Able to Concentrate , Alert , Linear Thinking , Goal Oriented    Mood/Affect:  Happy , Interested , Comfortable , Calm    CSW Assessment:  CSW received request for consult due to MOB presenting with a history of postpartum depression.  FOB was also present in the room during the assessment, but he did not participate. MOB was noted to be breastfeeding and caring for the infant during the assessment.  MOB presented in a pleasant mood, and displayed a full range in affect. No acute mental health symptoms observed or noted in the MOB's thought process.  MOB expressed eagerness and readiness for discharge. She stated that she has two other children at home, and reported that she misses them.  MOB reported that she has strong family support who will continue to provide support to her as she transitions home and adjusts to caring for  three children. She reported that the home is prepared for the infant.   MOB denied denied symptoms of depression or anxiety during the pregnancy, and reported that she currently feels "great".  Per MOB, after her first child was born, she noted that she cried "a lot" and would cry whenever the FOB would leave the home. She stated that she felt alone and nervous when he were to leave her. MOB reported that these symptoms occurred for approximately 1-2 weeks after the infant was born, and she never required additional treatment.  CSW noted that the MOB likely experienced the Baby Blues, and continued to provide education to MOB on the Baby Blues and perinatal mood and anxiety disorders. MOB denied any mental health concerns after her second child was born, and shared belief that it was because she felt more "prepared" and "knew" how to care for an infant. MOB agreed to contact her medical provider if she notes onset of symptoms.    MOB expressed appreciation for the visit, and denied additional questions, concerns, or needs at this time. She agreed to contact CSW if needs arise prior to discharge.  CSW Plan/Description:   1)Patient/Family Education: Perinatal mood and anxiety disorders 2)No Further Intervention Required/No Barriers to Discharge    Sheba Whaling N, LCSW 04/24/2015, 11:23 AM  

## 2015-04-24 NOTE — Lactation Note (Signed)
This note was copied from the chart of Michele Stefania Goulart. Lactation Consultation Note  Patient Name: Michele Galvan [id number] Date: 04/24/2015 Reason for consult: Follow-up assessment  Baby is 37 hours old , Late preterm ,breast / formula , 1% weight loss,  Voids , Qs for age , 2 stools in life and 1 smear. At  hours Bilicheck - 3.9. @ consult mom latched the baby independently , needed added pillow support.  LC noted swallows and a consistent pattern. Mom plans to supplement after wards .  Mom plans to rent a DEBP for 2 weeks until her insurance pump comes in.  Pump Paper work given with instruction and after D/C dad will drive around to the [facility] store to pick up pump  Sore nipple and engorgement prevention and tx reviewed .  Discussed supply and demand and late  pre-term potential feeding behaviors..  LC suggested after 5-6 feedings a day until milk comes in post pump 10-15 mins and when milk comes in after 4 feedings a day  And PRN.  Mother informed of post-discharge support and given phone number to the lactation department, including services for phone call  assistance; out-patient appointments; and breastfeeding support group. List of other breastfeeding resources in the community given  in the handout. Encouraged mother to call for problems or concerns related to breastfeeding.     Maternal Data Has patient been taught Hand Expression?: Yes  Feeding Feeding Type: Breast Fed Length of feed: 5 min  LATCH Score/Interventions Latch: Grasps breast easily, tongue down, lips flanged, rhythmical sucking.  Audible Swallowing: A few with stimulation Intervention(s): Skin to skin Intervention(s): Skin to skin  Type of Nipple: Everted at rest and after stimulation  Comfort (Breast/Nipple): Soft / non-tender     Hold (Positioning): No assistance needed to correctly position infant at breast.  LATCH Score: 9  Lactation Tools Discussed/Used WIC Program: No   Consult  Status Consult Status: Follow-up Date: 04/24/15    Michele Galvan 04/24/2015, 12:38 PM

## 2015-04-24 NOTE — Discharge Summary (Signed)
OB Discharge Summary  Patient Name: Michele Galvan DOB: [birth year] MRN: [id number]  Date of admission: 04/22/2015 Delivering MD: Durenda Hurt   Date of discharge: 04/24/2015  Admitting diagnosis: 37 WKS, CTXS Intrauterine pregnancy: [redacted]w[redacted]d     Secondary diagnosis: None     Discharge diagnosis: Term Pregnancy Delivered                                                                                                Post partum procedures:none  Augmentation: Pitocin  Complications: None  Hospital course:  Onset of Labor With Vaginal Delivery     29 y.o. yo [id number] at [redacted]w[redacted]d was admitted in Active Laboron 04/22/2015. Patient had an uncomplicated labor course as follows:  Membrane Rupture Time/Date: 5:05 PM ,04/22/2015   Intrapartum Procedures: Episiotomy: None [1]                                         Lacerations:  None [1]  Mediations and procedures used include: Pitocin  Patient had a delivery of a Viable infant. 04/22/2015  Information for the patient's newborn:  Michele, Galvan [[id number]]  Delivery Method: Vaginal, Spontaneous Delivery (Filed from Delivery Summary)    Pateint had an uncomplicated postpartum course.  She is ambulating, tolerating a regular diet, passing flatus, and urinating well. Patient is discharged home in stable condition on No discharge date for patient encounter.Marland Kitchen    Physical exam  Filed Vitals:   04/23/15 0100 04/23/15 0210 04/23/15 0638 04/23/15 1814  BP: 122/69 110/56 101/61 109/59  Pulse: 64 70 61 71  Temp: 98.5 F (36.9 C) 99.6 F (37.6 C) 98.3 F (36.8 C) 98 F (36.7 C)  TempSrc: Oral Oral Oral Oral  Resp: Height:      Weight:      SpO2: 98% 98% 99% 98%   General: alert, cooperative and no distress Lochia: appropriate Uterine Fundus: firm Incision: N/A DVT Evaluation: No evidence of DVT seen on physical exam. Negative Homan's sign. No cords or calf tenderness. Labs: Lab Results  Component Value  Date   WBC 10.3 04/22/2015   HGB 13.7 04/22/2015   HCT 39.8 04/22/2015   MCV 84.7 04/22/2015   PLT 255 04/22/2015   No flowsheet data found.  Discharge instruction: per After Visit Summary and "Baby and Me Booklet".  Medications:  Current facility-administered medications:  .  acetaminophen (TYLENOL) tablet 650 mg, 650 mg, Oral, Q4H PRN, Durenda Hurt, MD .  benzocaine-Menthol (DERMOPLAST) 20-0.5 % topical spray 1 application, 1 application, Topical, PRN, Durenda Hurt, MD .  witch hazel-glycerin (TUCKS) pad 1 application, 1 application, Topical, PRN **AND** dibucaine (NUPERCAINAL) 1 % rectal ointment 1 application, 1 application, Rectal, PRN, Durenda Hurt, MD .  diphenhydrAMINE (BENADRYL) capsule 25 mg, 25 mg, Oral, Q6H PRN, Durenda Hurt, MD .  ibuprofen (ADVIL,MOTRIN) tablet 600 mg, 600 mg, Oral, 4 times per day, Durenda Hurt, MD, 600 mg at 04/23/15 2357 .  lanolin ointment, , Topical, PRN, Durenda Hurt, MD .  ondansetron (ZOFRAN) tablet 4 mg, 4 mg, Oral, Q4H PRN **OR** ondansetron (ZOFRAN) injection 4 mg, 4 mg, Intravenous, Q4H PRN, Durenda Hurt, MD .  oxyCODONE-acetaminophen (PERCOCET/ROXICET) 5-325 MG per tablet 1 tablet, 1 tablet, Oral, Q4H PRN, Durenda Hurt, MD, 1 tablet at 04/23/15 2016 .  prenatal multivitamin tablet 1 tablet, 1 tablet, Oral, Q1200, Durenda Hurt, MD, 1 tablet at 04/23/15 1106 .  senna-docusate (Senokot-S) tablet 2 tablet, 2 tablet, Oral, Q24H, Durenda Hurt, MD, 2 tablet at 04/23/15 2356 .  simethicone (MYLICON) chewable tablet 80 mg, 80 mg, Oral, PRN, Durenda Hurt, MD .  tenofovir Stevie Kern) tablet 300 mg, 300 mg, Oral, QHS, Montez Morita, CNM, 300 mg at 04/23/15 2255 .  zolpidem (AMBIEN) tablet 5 mg, 5 mg, Oral, QHS PRN, Durenda Hurt, MD  Facility-Administered Medications Ordered in Other Encounters:  .  lidocaine (PF) (XYLOCAINE) 1 % injection, , , Anesthesia Intra-op, Cristela Blue, MD, 6 mL at 04/22/15 1654  Diet:  routine diet  Activity: Advance as tolerated. Pelvic rest for 6 weeks.   Outpatient follow up:6 weeks  Postpartum contraception: Undecided  Newborn Data: Live born female  Birth Weight: 6 lb 15.3 oz (3155 g) APGAR: 7, 9  Baby Feeding: Breast Disposition:home with mother   04/24/2015 Ferdie Ping, CNM

## 2015-04-24 NOTE — Progress Notes (Signed)
Mom requesting TDaP prior to D/C- she previously declined vaccine, but now wants  TDaP given. RN to give prior to D/C

## 2015-06-01 ENCOUNTER — Encounter: Payer: 59 | Admitting: Physician Assistant

## 2015-06-01 VITALS — BP 104/60 | HR 74 | Temp 98.0°F | Resp 18 | Ht 61.0 in | Wt 127.0 lb

## 2015-06-01 DIAGNOSIS — Z09 Encounter for follow-up examination after completed treatment for conditions other than malignant neoplasm: Secondary | ICD-10-CM

## 2015-08-24 NOTE — Progress Notes (Signed)
Chief Complaint  Patient presents with  . post partum eval to return towork    needs eval to return to work and to pump at work also     Patient was not seen here today She was inquiring of obgyn clearance to return to work which is not performed here.   Phone calls placed to the [facility] department--without phone call. She will need to return here to get her clearance, as she missed her appointment initially.

## 2015-12-21 ENCOUNTER — Ambulatory Visit (INDEPENDENT_AMBULATORY_CARE_PROVIDER_SITE_OTHER): Payer: BLUE CROSS/BLUE SHIELD | Admitting: Physician Assistant

## 2015-12-21 VITALS — BP 110/66 | HR 86 | Temp 98.0°F | Resp 16 | Ht 62.0 in | Wt 121.0 lb

## 2015-12-21 DIAGNOSIS — K649 Unspecified hemorrhoids: Secondary | ICD-10-CM | POA: Diagnosis not present

## 2015-12-21 DIAGNOSIS — Z3009 Encounter for other general counseling and advice on contraception: Secondary | ICD-10-CM

## 2015-12-21 NOTE — Patient Instructions (Addendum)
The specialty offices will call you directly to schedule visits there.    IF you received an x-ray today, you will receive an invoice from [facility]. Please contact [facility] at [phone] with questions or concerns regarding your invoice.   IF you received labwork today, you will receive an invoice from [facility]. Please contact [facility] at [phone] with questions or concerns regarding your invoice.   Our billing staff will not be able to assist you with questions regarding bills from these companies.  You will be contacted with the lab results as soon as they are available. The fastest way to get your results is to activate your My Chart account. Instructions are located on the last page of this paperwork. If you have not heard from us regarding the results in 2 weeks, please contact this office.

## 2015-12-21 NOTE — Progress Notes (Signed)
   Patient ID: Michele Galvan, female    DOB: 08/09/1985, 30 y.o.   MRN: [id number]  PCP: No PCP Per Patient  Subjective:   Chief Complaint  Patient presents with  . Hemorrhoids    almost a year    HPI Presents for evaluation of hemorrhoids.  Developed hemorrhoids with her second pregnancy, but were not bothersome after delivery. Recurrent symptoms during her third pregnancy, (delivery 8 months ago) and symptoms persist intermittently, worse than before. For the past 2 weeks it's been worse, with pain and pressure. Not bleeding. Using OTC hemorrhoidal ointment without benefit. Sometimes had hard stools requiring straining.  Used benefiber, but caused her infant to have diarrhea.  Not currently using contraception, but does not desire pregnancy at this time.  Has started introducing cereal to her infant's regimen. 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 Has resumed a regular menstrual cycle, LMP 11/29/15.   Review of Systems As above    Patient Active Problem List   Diagnosis Date Noted  . Chronic hepatitis B (HCC) 09/09/2013     Prior to Admission medications   Medication Sig Start Date End Date Taking? Authorizing Provider  Prenatal Vit-Fe Fumarate-FA (PRENATAL VITAMIN PO) Take 1 tablet by mouth daily.    Yes Historical Provider, MD  tenofovir (VIREAD) 300 MG tablet Take 300 mg by mouth at bedtime.   Yes Historical Provider, MD     Allergies  Allergen Reactions  . Latex Itching       Objective:  Physical Exam  Constitutional: She is oriented to person, place, and time. She appears well-developed and well-nourished. She is active and cooperative. No distress.  BP 110/66 mmHg  Pulse 86  Temp(Src) 98 F (36.7 C) (Oral)  Resp 16  Ht 5\' 2"  (1.575 m)  Wt 121 lb (54.885 kg)  BMI 22.13 kg/m2  SpO2 100%  LMP 11/29/2015  Breastfeeding? Yes   Eyes: Conjunctivae are normal.    Pulmonary/Chest: Effort normal.  Genitourinary: Rectal exam shows external hemorrhoid and tenderness. Rectal exam shows no fissure, no mass and anal tone normal.  Neurological: She is alert and oriented to person, place, and time.  Psychiatric: She has a normal mood and affect. Her speech is normal and behavior is normal.           Assessment & Plan:   Hemorrhoids, unspecified hemorrhoid type - Plan: Ambulatory referral to General Surgery  Encounter for other general counseling or advice on contraception - Plan: Ambulatory referral to Obstetrics / Gynecology She is interested in Nexplanon or IUD.   Fernande Brashelle S. Keyonda Bickle, PA-C Physician Assistant-Certified [facility]

## 2016-01-08 ENCOUNTER — Telehealth: Payer: Self-pay

## 2016-01-08 NOTE — Telephone Encounter (Signed)
Patient would like to change her referral to [facility]. She was [facility] and states she hasn't got a phone call from them in 3 weeks. [phone]

## 2016-02-19 ENCOUNTER — Encounter: Payer: Medicaid Other | Admitting: Obstetrics & Gynecology

## 2016-07-01 ENCOUNTER — Ambulatory Visit (INDEPENDENT_AMBULATORY_CARE_PROVIDER_SITE_OTHER): Payer: BLUE CROSS/BLUE SHIELD | Admitting: Family Medicine

## 2016-07-01 VITALS — BP 109/69 | HR 75 | Temp 98.5°F | Resp 17 | Ht 62.0 in | Wt 119.0 lb

## 2016-07-01 DIAGNOSIS — J0110 Acute frontal sinusitis, unspecified: Secondary | ICD-10-CM | POA: Diagnosis not present

## 2016-07-01 MED ORDER — AMOXICILLIN-POT CLAVULANATE 875-125 MG PO TABS: 1.0000 | ORAL_TABLET | Freq: Two times a day (BID) | ORAL | 0 refills | Status: DC

## 2016-07-01 MED ORDER — BENZONATATE 100 MG PO CAPS: 100.0000 mg | ORAL_CAPSULE | Freq: Three times a day (TID) | ORAL | 0 refills | Status: DC | PRN

## 2016-07-01 MED ORDER — FLUCONAZOLE 150 MG PO TABS: 150.0000 mg | ORAL_TABLET | Freq: Once | ORAL | 0 refills | Status: AC

## 2016-07-01 NOTE — Progress Notes (Signed)
Patient ID: Michele [name], female    DOB: 01/15/1986, 30 y.o.   MRN: [id number]  PCP: No PCP Per Patient  Chief Complaint  Patient presents with  . Sinus Problem    x2days and sore throat     Subjective:   HPI 30 year old female presents with a complaint of sinus pressure ,congestion and sore throat x 2 day. [facility]. She reports two weeks of nasal draining and nonproductive coughing which is worst at night. Reports an irritated throat, with lymph nodes that seem enlarged. Reports an intermittent headache. Denies fever, dizziness times two weeks ago and nausea.  Social History   Social History  . Marital status: Married    Spouse name: Lyndon CodeJose [name]-Jimenez  . Number of children: 3  . Years of education: 12th grade   Occupational History  . automation/machine operator     [employer]   Social History Main Topics  . Smoking status: Never Smoker  . Smokeless tobacco: Never Used  . Alcohol use No  . Drug use: No  . Sexual activity: Yes    Partners: Male    Birth control/ protection: None   Other Topics Concern  . Not on file   Social History Narrative   Lives with her husband and their 3 sons, and her mother.   Originally from [place]. Came to the [place] at age 30.        Family History  Problem Relation Age of Onset  . Anemia Mother   . Hypertension Mother     Review of Systems See HPI  Patient Active Problem List   Diagnosis Date Noted  . Chronic hepatitis B (HCC) 09/09/2013     Prior to Admission medications   Medication Sig Start Date End Date Taking? Authorizing Provider  tenofovir (VIREAD) 300 MG tablet Take 300 mg by mouth at bedtime.   Yes Historical Provider, MD  Prenatal Vit-Fe Fumarate-FA (PRENATAL VITAMIN PO) Take 1 tablet by mouth daily.     Historical Provider, MD   Allergies  Allergen Reactions  . Latex Itching      Objective:  Physical Exam  Constitutional: She is oriented to person, place, and time. She  appears well-developed and well-nourished.  HENT:  Head: Head is with raccoon's eyes.  Right Ear: Hearing, tympanic membrane, external ear and ear canal normal.  Left Ear: Hearing, tympanic membrane, external ear and ear canal normal.  Nose: Mucosal edema and rhinorrhea present.  Mouth/Throat: Posterior oropharyngeal erythema present.  Eyes:  Erythema present bilateral sclera  Neck: Normal range of motion and full passive range of motion without pain. Neck supple.  Cardiovascular: Normal rate, regular rhythm, normal heart sounds and intact distal pulses.   Pulmonary/Chest: Effort normal and breath sounds normal.  Musculoskeletal: Normal range of motion.  Neurological: She is alert and oriented to person, place, and time.  Skin: Skin is warm and dry.  Psychiatric: She has a normal mood and affect. Her behavior is normal. Judgment and thought content normal.      Vitals:   07/01/16 1642  BP: 109/69  Pulse: 75  Resp: 17  Temp: 98.5 F (36.9 C)   Assessment & Plan:  1. Acute frontal sinusitis, recurrence not specified, treated empirically with an antibiotic. Pt is chronically prescribed antiviral medication to treat Hepatitis C and appears today ill which lowers my threshold to treat illness more aggressively in light of symptoms being present for less than 1 week. Plan: -Start Augmentin 1 tablet  twice daily with food to avoid stomach upset. Complete all medication. -Take Fluconazole (Diflucan) 150 mg once if vaginal irritation develops. -Take Benzonatate 100-200 mg up to 3 times daily for cough as needed.  Return for follow-up as needed.  Godfrey PickKimberly S. Tiburcio PeaHarris, MSN, FNP-C [facility]

## 2016-07-01 NOTE — Patient Instructions (Signed)
Take Augmentin 875-125 mg twice daily x 10 days.  Diflucan 150 mg if vaginal irritation occurs.  Take Benzonatate 100-200 mg up to 3 times daily for cough as needed.   IF you received an x-ray today, you will receive an invoice from [facility]. Please contact [facility] at [phone] with questions or concerns regarding your invoice.   IF you received labwork today, you will receive an invoice from [facility]. Please contact [facility] at [phone] with questions or concerns regarding your invoice.   Our billing staff will not be able to assist you with questions regarding bills from these companies.  You will be contacted with the lab results as soon as they are available. The fastest way to get your results is to activate your My Chart account. Instructions are located on the last page of this paperwork. If you have not heard from us regarding the results in 2 weeks, please contact this office.

## 2016-08-01 ENCOUNTER — Ambulatory Visit (INDEPENDENT_AMBULATORY_CARE_PROVIDER_SITE_OTHER): Payer: BLUE CROSS/BLUE SHIELD | Admitting: Physician Assistant

## 2016-08-01 VITALS — BP 108/64 | HR 82 | Temp 98.3°F | Resp 18 | Ht 62.0 in | Wt 120.0 lb

## 2016-08-01 DIAGNOSIS — J358 Other chronic diseases of tonsils and adenoids: Secondary | ICD-10-CM

## 2016-08-01 DIAGNOSIS — R0982 Postnasal drip: Secondary | ICD-10-CM

## 2016-08-01 DIAGNOSIS — J3489 Other specified disorders of nose and nasal sinuses: Secondary | ICD-10-CM | POA: Diagnosis not present

## 2016-08-01 MED ORDER — PSEUDOEPHEDRINE HCL ER 120 MG PO TB12: 120.0000 mg | ORAL_TABLET | Freq: Two times a day (BID) | ORAL | 0 refills | Status: DC

## 2016-08-01 MED ORDER — FLUTICASONE PROPIONATE 50 MCG/ACT NA SUSP: 2.0000 | Freq: Every day | NASAL | 6 refills | Status: DC

## 2016-08-01 NOTE — Progress Notes (Signed)
   Michele LampMellissa Galvan  MRN: [id number] DOB: 08/10/1985  PCP: No PCP Per Patient  Subjective:  Pt is a 31 year old female who presents to clinic for sore throat and ear pain.  She was here about one month ago, treated for sinusitis with Amoxicillin and tessalon Pearls. She is feeling mostly better. Today she developed a sore throat.  + left ear pain, left nasal sinus pain, nasal drainage from left side of nose.  +"little bumps in the back of her throat". She reports nasal drainage in the back of her throat at night. She is concerned about the bumps in her throat.  Denies fever, chills, headache, lightheadedness, chest pain, palpitations, SOB, wheezing. She has tried salt water gargles, helped some.   Review of Systems  Constitutional: Negative for chills, diaphoresis, fatigue and fever.  HENT: Positive for postnasal drip, sinus pressure and sore throat. Negative for congestion, rhinorrhea, sneezing and trouble swallowing.   Respiratory: Positive for cough. Negative for chest tightness, shortness of breath and wheezing.   Cardiovascular: Negative for chest pain and palpitations.  Gastrointestinal: Negative for abdominal pain, diarrhea, nausea and vomiting.  Neurological: Negative for weakness, light-headedness and headaches.  Psychiatric/Behavioral: Positive for sleep disturbance.    Patient Active Problem List   Diagnosis Date Noted  . Chronic hepatitis B (HCC) 09/09/2013    Current Outpatient Prescriptions on File Prior to Visit  Medication Sig Dispense Refill  . tenofovir (VIREAD) 300 MG tablet Take 300 mg by mouth at bedtime.     No current facility-administered medications on file prior to visit.     Allergies  Allergen Reactions  . Latex Itching     Objective:  BP 108/64   Pulse 82   Temp 98.3 F (36.8 C) (Oral)   Resp 18   Ht 5\' 2"  (1.575 m)   Wt 120 lb (54.4 kg)   SpO2 98%   BMI 21.95 kg/m   Physical Exam  Constitutional: She is oriented to person, place, and  time and well-developed, well-nourished, and in no distress. No distress.  HENT:  Right Ear: Tympanic membrane normal.  Left Ear: Tympanic membrane normal.  Nose: Mucosal edema present. No rhinorrhea.  Mouth/Throat: Mucous membranes are normal. Posterior oropharyngeal edema present. No oropharyngeal exudate or posterior oropharyngeal erythema.  Tonsilloliths right tonsil   Cardiovascular: Normal rate, regular rhythm and normal heart sounds.   Pulmonary/Chest: Effort normal and breath sounds normal.  Neurological: She is alert and oriented to person, place, and time. GCS score is 15.  Skin: Skin is warm and dry.  Psychiatric: Mood, memory, affect and judgment normal.  Vitals reviewed.   Assessment and Plan :  1. Post-nasal drip 2. Nasal drainage 3. Tonsillolith 4. Sinus pressure - fluticasone (FLONASE) 50 MCG/ACT nasal spray; Place 2 sprays into both nostrils daily.  Dispense: 16 g; Refill: 6 - pseudoephedrine (SUDAFED 12 HOUR) 120 MG 12 hr tablet; Take 1 tablet (120 mg total) by mouth 2 (two) times daily.  Dispense: 30 tablet; Refill: 0 - Supportive care: Flonase HS RTC in 5-7 days if no improvement.   Marco CollieWhitney Marguarite Markov, PA-C  [facility] 08/01/2016 4:28 PM

## 2016-08-01 NOTE — Patient Instructions (Addendum)
  Flonase: 2 sprays each nostril at bedtime and in the morning. Hot showers can help you cough up mucus. Please come back in 5-7 days if not better.   Thank you for coming in today. I hope you feel we met your needs.  Feel free to call [facility] if you have any questions or further requests.  Please consider signing up for MyChart if you do not already have it, as this is a great way to communicate with me.  Best,  Whitney McVey, PA-C   IF you received an x-ray today, you will receive an invoice from [facility]. Please contact [facility] at [phone] with questions or concerns regarding your invoice.   IF you received labwork today, you will receive an invoice from [place]. Please contact [facility] at [phone] with questions or concerns regarding your invoice.   Our billing staff will not be able to assist you with questions regarding bills from these companies.  You will be contacted with the lab results as soon as they are available. The fastest way to get your results is to activate your My Chart account. Instructions are located on the last page of this paperwork. If you have not heard from [place] regarding the results in 2 weeks, please contact this office.

## 2016-08-23 ENCOUNTER — Ambulatory Visit (HOSPITAL_COMMUNITY)
Admission: EM | Admit: 2016-08-23 | Discharge: 2016-08-23 | Disposition: A | Discharge status: Home / Self Care | Payer: BLUE CROSS/BLUE SHIELD | Attending: Family Medicine | Admitting: Family Medicine

## 2016-08-23 ENCOUNTER — Encounter (HOSPITAL_COMMUNITY): Payer: Self-pay | Admitting: *Deleted

## 2016-08-23 DIAGNOSIS — J029 Acute pharyngitis, unspecified: Secondary | ICD-10-CM | POA: Diagnosis not present

## 2016-08-23 LAB — POCT INFECTIOUS MONO SCREEN: Mono Screen: NEGATIVE

## 2016-08-23 NOTE — Discharge Instructions (Signed)
Your mono test is negative. It is unlikely that you have strep test in that the amoxicillin would have cured that. Her throat does not appear to be infected by bacteria at this time. This all appears to be viral. What extent hepatitis B may have but this is uncertain. The sores in your mouth are also caused by virus. These generally go away on her own. Applying numbing medicine such as Orajel and helps him to be less painful. Also cool or drinks or ice chips sometimes help. Taking ibuprofen may help with pain and definitely taking an antihistamine such as Allegra or Zyrtec to minimize drainage in the back of your throat should help as well. He had quite a bit of drainage. It is best to have a primary care provider to help coordinate all of your care.

## 2016-08-23 NOTE — ED Provider Notes (Signed)
\ CSN: [id number]     Arrival date & time 08/23/16  1411 History   First MD Initiated Contact with Patient 08/23/16 1609     Chief Complaint  Patient presents with  . Sore Throat   (Consider location/radiation/quality/duration/timing/severity/associated sxs/prior Treatment) This is the fourth visit to a physician for this 31 year old female for her sore throat. She has had this for one month. Initially she went to an urgent care course prescribed amoxicillin for 10 days. She states that did not change anything for help anything. She went back was told she had a sinus infection and was given a nasal spray. She states she did not benefit from that. She was then referred to ear nose and throat to "stated nothing was wrong". She has had no fever or chills. She is complaining of having white spots on the back of her throat as well as sore box and red spots on the road from her mouth and gums.      Past Medical History:  Diagnosis Date  . 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 Hepatitis    Hep B  . Malpresentation of fetus, antepartum 04/22/2015   Breech on admission to labor and delivery   . NSVD (normal spontaneous vaginal delivery) 11/30/2013  . Post partum depression 04/22/2015   Past Surgical History:  Procedure Laterality Date  . NO PAST SURGERIES     Family History  Problem Relation Age of Onset  . Anemia Mother   . Hypertension Mother    Social History  Substance Use Topics  . Smoking status: Never Smoker  . Smokeless tobacco: Never Used  . Alcohol use No   OB History    Gravida Para Term Preterm AB Living   4 3 2 1 1 3    SAB TAB Ectopic Multiple Live Births     1   0 3     Review of Systems  Constitutional: Negative for activity change, fatigue and fever.  HENT: Positive for rhinorrhea and sore throat. Negative for congestion and postnasal drip.   Eyes: Negative.   Respiratory: Negative.  Negative for shortness of breath.   Gastrointestinal:  Negative.   Neurological: Negative.   Psychiatric/Behavioral: Negative.   All other systems reviewed and are negative.   Allergies  Latex  Home Medications   Prior to Admission medications   Medication Sig Start Date End Date Taking? Authorizing Provider  tenofovir (VIREAD) 300 MG tablet Take 300 mg by mouth at bedtime.    Historical Provider, MD   Meds Ordered and Administered this Visit  Medications - No data to display  BP 132/74 (BP Location: Right Arm)   Pulse 78   Temp 98.5 F (36.9 C) (Oral)   Resp 18   LMP 08/21/2016   SpO2 100%  No data found.   Physical Exam  Constitutional: She is oriented to person, place, and time. She appears well-developed and well-nourished.  HENT:  Head: Atraumatic.  Mouth/Throat: No oropharyngeal exudate.  BiLateral TMs are normal. Oropharynx with minor erythema, much cobblestoning and clear PND. There is erythema along the soft palate however there are no "white spots" no exudates. No palatine tonsils, no enlargement of tissue. There are a few very small pinpoint red tender lesion similar to aphthous ulcers along the anterior gum line and lower lip because surface.  Eyes: EOM are normal.  Neck: Normal range of motion. Neck supple.  Cardiovascular: Normal rate, regular rhythm and normal heart sounds.   Pulmonary/Chest:  Effort normal and breath sounds normal. No respiratory distress. She has no wheezes.  Musculoskeletal: Normal range of motion.  Lymphadenopathy:    She has no cervical adenopathy.  Neurological: She is alert and oriented to person, place, and time.  Skin: Skin is dry. No rash noted.  Psychiatric: She has a normal mood and affect.  Nursing note and vitals reviewed.   Urgent Care Course     Procedures (including critical care time)  Labs Review Labs Reviewed  POCT INFECTIOUS MONO SCREEN    Imaging Review No results found.   Visual Acuity Review  Right Eye Distance:   Left Eye Distance:   Bilateral  Distance:    Right Eye Near:   Left Eye Near:    Bilateral Near:         MDM   1. Pharyngitis, unspecified etiology    Your mono test is negative. It is unlikely that you have strep test in that the amoxicillin would have cured that. Her throat does not appear to be infected by bacteria at this time. This all appears to be viral. What extent hepatitis B may have but this is uncertain. The sores in your mouth are also caused by virus. These generally go away on her own. Applying numbing medicine such as Orajel and helps him to be less painful. Also cool or drinks or ice chips sometimes help. Taking ibuprofen may help with pain and definitely taking an antihistamine such as Allegra or Zyrtec to minimize drainage in the back of your throat should help as well. He had quite a bit of drainage. It is best to have a primary care provider to help coordinate all of your care.    Hayden Rasmussenavid Awais Cobarrubias, NP 08/23/16 [phone]

## 2016-08-23 NOTE — ED Triage Notes (Signed)
PT  REPORTS   ABOUT  1  MONTH  AGO  STARTED  OFF  WITH  A   SORETHROAT   WITH A   SENSATION  OF  SOMETHING  STUCK IN THROAT   WAS  TREATED  WITH  COUURSE  OF  ANTI  BIOTICS  INITIALYY  AND  HAS  BEEN  SEEN  X  3   FOR  THIS  CONTINUING  PROBLEM  SHE  STATES    HAS  NOT HAD  A  STREP  TEST  OR  MONO  TEST  YET

## 2016-10-26 ENCOUNTER — Emergency Department (HOSPITAL_COMMUNITY)
Admission: EM | Admit: 2016-10-26 | Discharge: 2016-10-26 | Disposition: A | Discharge status: Home / Self Care | Payer: BLUE CROSS/BLUE SHIELD | Attending: Physician Assistant | Admitting: Physician Assistant

## 2016-10-26 ENCOUNTER — Encounter (HOSPITAL_COMMUNITY): Payer: Self-pay | Admitting: Emergency Medicine

## 2016-10-26 DIAGNOSIS — R197 Diarrhea, unspecified: Secondary | ICD-10-CM | POA: Insufficient documentation

## 2016-10-26 DIAGNOSIS — R112 Nausea with vomiting, unspecified: Secondary | ICD-10-CM | POA: Insufficient documentation

## 2016-10-26 DIAGNOSIS — Z91040 Latex allergy status: Secondary | ICD-10-CM | POA: Diagnosis not present

## 2016-10-26 LAB — CBC WITH DIFFERENTIAL/PLATELET
BASOS ABS: 0 10*3/uL (ref 0.0–0.1)
Basophils Relative: 1 %
Eosinophils Absolute: 0.1 10*3/uL (ref 0.0–0.7)
Eosinophils Relative: 1 %
HEMATOCRIT: 39.4 % (ref 36.0–46.0)
Hemoglobin: 13 g/dL (ref 12.0–15.0)
Lymphocytes Relative: 27 %
Lymphs Abs: 1.6 10*3/uL (ref 0.7–4.0)
MCH: 28.4 pg (ref 26.0–34.0)
MCHC: 33 g/dL (ref 30.0–36.0)
MCV: 86 fL (ref 78.0–100.0)
MONO ABS: 0.3 10*3/uL (ref 0.1–1.0)
MONOS PCT: 6 %
NEUTROS ABS: 3.9 10*3/uL (ref 1.7–7.7)
Neutrophils Relative %: 65 %
Platelets: 295 10*3/uL (ref 150–400)
RBC: 4.58 MIL/uL (ref 3.87–5.11)
RDW: 12.8 % (ref 11.5–15.5)
WBC: 5.9 10*3/uL (ref 4.0–10.5)

## 2016-10-26 LAB — COMPREHENSIVE METABOLIC PANEL
ALBUMIN: 4.2 g/dL (ref 3.5–5.0)
ALT: 17 U/L (ref 14–54)
ANION GAP: 7 (ref 5–15)
AST: 18 U/L (ref 15–41)
Alkaline Phosphatase: 53 U/L (ref 38–126)
BUN: 8 mg/dL (ref 6–20)
CO2: 25 mmol/L (ref 22–32)
Calcium: 9.3 mg/dL (ref 8.9–10.3)
Chloride: 105 mmol/L (ref 101–111)
Creatinine, Ser: 0.62 mg/dL (ref 0.44–1.00)
GFR calc Af Amer: >60 mL/min (ref >60)
GFR calc non Af Amer: >60 mL/min (ref >60)
GLUCOSE: 92 mg/dL (ref 65–99)
Potassium: 4 mmol/L (ref 3.5–5.1)
SODIUM: 137 mmol/L (ref 135–145)
TOTAL PROTEIN: 7.3 g/dL (ref 6.5–8.1)
Total Bilirubin: 1 mg/dL (ref 0.3–1.2)

## 2016-10-26 LAB — I-STAT BETA HCG BLOOD, ED (MC, WL, AP ONLY): I-stat hCG, quantitative: <5 m[IU]/mL (ref <5)

## 2016-10-26 LAB — URINALYSIS, ROUTINE W REFLEX MICROSCOPIC
BILIRUBIN URINE: NEGATIVE
Glucose, UA: NEGATIVE mg/dL
Hgb urine dipstick: NEGATIVE
Ketones, ur: NEGATIVE mg/dL
Leukocytes, UA: NEGATIVE
NITRITE: NEGATIVE
PH: 6 (ref 5.0–8.0)
Protein, ur: NEGATIVE mg/dL
SPECIFIC GRAVITY, URINE: 1.012 (ref 1.005–1.030)

## 2016-10-26 LAB — LIPASE, BLOOD: Lipase: 13 U/L (ref 11–51)

## 2016-10-26 MED ORDER — SODIUM CHLORIDE 0.9 % IV BOLUS (SEPSIS)
1000.0000 mL | Freq: Once | INTRAVENOUS | Status: AC
  Administered 2016-10-26: 1000 mL via INTRAVENOUS

## 2016-10-26 MED ORDER — ONDANSETRON HCL 4 MG/2ML IJ SOLN
4.0000 mg | Freq: Once | INTRAMUSCULAR | Status: AC
  Administered 2016-10-26: 4 mg via INTRAVENOUS
  Filled 2016-10-26: qty 2

## 2016-10-26 MED ORDER — ONDANSETRON 4 MG PO TBDP: 4.0000 mg | ORAL_TABLET | Freq: Three times a day (TID) | ORAL | 0 refills | Status: DC | PRN

## 2016-10-26 MED ORDER — GI COCKTAIL ~~LOC~~
30.0000 mL | Freq: Once | ORAL | Status: AC
  Administered 2016-10-26: 30 mL via ORAL
  Filled 2016-10-26: qty 30

## 2016-10-26 NOTE — ED Provider Notes (Signed)
[facility] Provider Note   CSN: [id number] Arrival date & time: 10/26/16  4098     History   Chief Complaint Chief Complaint  Patient presents with  . Emesis    HPI Elnora Quizon is a 31 y.o. female.  HPI   Patient is a 31 year old female presenting with 3 episodes of vomiting this morning. Patient reports that she ate fried fish last night at 3 PM. She hasn't really eaten since then because she was feeling sad after  the loss of the family member. Denies any alcohol use or travel. She states that she vomited a couple times and there is some bright red in it so she was concerned and came here to the emergency department.  No black vomit, no black stool, or bright red blood in her stool.    Past Medical History:  Diagnosis Date  . 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 Hepatitis    Hep B  . Malpresentation of fetus, antepartum 04/22/2015   Breech on admission to labor and delivery   . NSVD (normal spontaneous vaginal delivery) 11/30/2013  . Post partum depression 04/22/2015    Patient Active Problem List   Diagnosis Date Noted  . Chronic hepatitis B (HCC) 09/09/2013    Past Surgical History:  Procedure Laterality Date  . NO PAST SURGERIES      OB History    Gravida Para Term Preterm AB Living   SAB TAB Ectopic Multiple Live Births     1   0 3       Home Medications    Prior to Admission medications   Medication Sig Start Date End Date Taking? Authorizing Provider  amoxicillin-clavulanate (AUGMENTIN) 875-125 MG tablet Take 1 tablet by mouth 2 (two) times daily. 10/15/16  Yes Historical Provider, MD  tenofovir (VIREAD) 300 MG tablet Take 300 mg by mouth at bedtime.   Yes Historical Provider, MD    Family History Family History  Problem Relation Age of Onset  . Anemia Mother   . Hypertension Mother     Social History Social History  Substance Use Topics  . Smoking status: Never Smoker  . Smokeless tobacco:  Never Used  . Alcohol use No     Allergies   Latex   Review of Systems Review of Systems  Constitutional: Negative for activity change.  Respiratory: Negative for shortness of breath.   Cardiovascular: Negative for chest pain.  Gastrointestinal: Positive for vomiting. Negative for abdominal distention, abdominal pain, blood in stool and diarrhea.  All other systems reviewed and are negative.    Physical Exam Updated Vital Signs BP 116/71 (BP Location: Right Arm)   Pulse 95   Temp 98.7 F (37.1 C) (Oral)   Resp 18   SpO2 100%   Physical Exam  Constitutional: She is oriented to person, place, and time. She appears well-developed and well-nourished.  HENT:  Head: Normocephalic and atraumatic.  Eyes: Right eye exhibits no discharge.  Cardiovascular: Normal rate, regular rhythm and normal heart sounds.   No murmur heard. Pulmonary/Chest: Effort normal and breath sounds normal. She has no wheezes. She has no rales.  Abdominal: Soft. She exhibits no distension. There is no tenderness.  Neurological: She is oriented to person, place, and time.  Skin: Skin is warm and dry. She is not diaphoretic.  Psychiatric: She has a normal mood and affect.  Nursing note and vitals reviewed.    ED Treatments /  Results  Labs (all labs ordered are listed, but only abnormal results are displayed) Labs Reviewed  COMPREHENSIVE METABOLIC PANEL  CBC WITH DIFFERENTIAL/PLATELET  LIPASE, BLOOD  URINALYSIS, ROUTINE W REFLEX MICROSCOPIC  I-STAT BETA HCG BLOOD, ED ([facility], [facility], [facility] ONLY)    EKG  EKG Interpretation None       Radiology No results found.  Procedures Procedures (including critical care time)  Medications Ordered in ED Medications  sodium chloride 0.9 % bolus 1,000 mL (not administered)  ondansetron (ZOFRAN) injection 4 mg (not administered)  gi cocktail (Maalox,Lidocaine,Donnatal) (not administered)     Initial Impression / Assessment and Plan / ED Course  I have  reviewed the triage vital signs and the nursing notes.  Pertinent labs & imaging results that were available during my care of the patient were reviewed by me and considered in my medical decision making (see chart for details).    Well-appearing 31 year old female presenting with vomiting 3. Patient is concerned because of the bright red blood she saw. Patient took a picture of her vomit, do not see any blood currently. However if there were some, it may be that it was a Mallory-Weiss tear. Otherwise patient appears very well appearing. We'll do labs, give fluids, give Zofran, by mouth challenge. Patient's abdomen soft do not suspect intra-abdominal pathology. No further vomiting here.   1:26 PM Patient tolerated  take-out [place] food prior to discharge.  Final Clinical Impressions(s) / ED Diagnoses   Final diagnoses:  None    New Prescriptions New Prescriptions   No medications on file     Jerome Otter Randall An, MD 10/26/16 1327

## 2016-10-26 NOTE — ED Notes (Addendum)
Pt has vomited x 1 while in the ED, states "just a small amount" pt is asking about an ultrasound, c/o headache.

## 2016-10-26 NOTE — ED Notes (Signed)
Pt states she is hep B positive, has had treatment with a dr in [facility]. States stopped taking the medicine.

## 2016-10-26 NOTE — Discharge Instructions (Signed)
Please stop with her gastroenterologist and her primary care physician. Please return for unable to stay hydrated or any other concerns.

## 2018-09-23 ENCOUNTER — Ambulatory Visit (HOSPITAL_COMMUNITY)
Admission: EM | Admit: 2018-09-23 | Discharge: 2018-09-23 | Disposition: A | Discharge status: Home / Self Care | Payer: BLUE CROSS/BLUE SHIELD | Attending: Family Medicine | Admitting: Family Medicine

## 2018-09-23 ENCOUNTER — Encounter (HOSPITAL_COMMUNITY): Payer: Self-pay

## 2018-09-23 DIAGNOSIS — R6889 Other general symptoms and signs: Secondary | ICD-10-CM

## 2018-09-23 NOTE — ED Triage Notes (Signed)
Pt presents with vomiting with some blood in it and congestion.

## 2018-09-23 NOTE — Discharge Instructions (Addendum)
I believe this is a viral illness  You can try mucinex DM over the counter for cough and congestion Zyrtec D for drainage, post nasal drip and nasal congestion.  Rest, drink plenty of fluids Follow up as needed for continued or worsening symptoms

## 2018-09-23 NOTE — ED Provider Notes (Signed)
[facility]    CSN: [id number] Arrival date & time: 09/23/18  1232     History   Chief Complaint Chief Complaint  Patient presents with  . Emesis    HPI Michele Galvan is a 33 y.o. female.   Pt is a 33 year old female that presents with cough, congestion, fever, postnasal drip, low-grade fever, myalgias, vomiting.  Symptoms have been waxing and waning for approximate 5 to 6 days.  The fever has subsided.  Reports that she has been mostly vomiting up mucus with some blood tinge.  She has been using NyQuil with some relief of symptoms. Denies any recent sick contacts or recent traveling.   ROS per HPI      Past Medical History:  Diagnosis Date  . 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 Hepatitis    Hep B  . Malpresentation of fetus, antepartum 04/22/2015   Breech on admission to labor and delivery   . NSVD (normal spontaneous vaginal delivery) 11/30/2013  . Post partum depression 04/22/2015    Patient Active Problem List   Diagnosis Date Noted  . Chronic hepatitis B (HCC) 09/09/2013    Past Surgical History:  Procedure Laterality Date  . NO PAST SURGERIES      OB History    Gravida  4   Para  3   Term  2   Preterm  1   AB  1   Living  3     SAB      TAB  1   Ectopic      Multiple  0   Live Births  3            Home Medications    Prior to Admission medications   Medication Sig Start Date End Date Taking? Authorizing Provider  tenofovir (VIREAD) 300 MG tablet Take 300 mg by mouth at bedtime.    [provider]    Family History Family History  Problem Relation Age of Onset  . Anemia Mother   . Hypertension Mother     Social History Social History   Tobacco Use  . Smoking status: Never Smoker  . Smokeless tobacco: Never Used  Substance Use Topics  . Alcohol use: No    Alcohol/week: 0.0 standard drinks  . Drug use: No     Allergies   Latex   Review of Systems Review of  Systems   Physical Exam Triage Vital Signs ED Triage Vitals  Enc Vitals Group     BP 09/23/18 1307 105/74     Pulse Rate 09/23/18 1307 88     Resp 09/23/18 1307 18     Temp 09/23/18 1307 98.2 F (36.8 C)     Temp Source 09/23/18 1307 Oral     SpO2 09/23/18 1307 99 %     Weight --      Height --      Head Circumference --      Peak Flow --      Pain Score 09/23/18 1308 5     Pain Loc --      Pain Edu? --      Excl. in GC? --    No data found.  Updated Vital Signs BP 105/74 (BP Location: Left Arm)   Pulse 88   Temp 98.2 F (36.8 C) (Oral)   Resp 18   SpO2 99%   Visual Acuity Right Eye Distance:   Left Eye Distance:   Bilateral  Distance:    Right Eye Near:   Left Eye Near:    Bilateral Near:     Physical Exam Vitals signs and nursing note reviewed.  Constitutional:      General: She is not in acute distress.    Appearance: Normal appearance. She is well-developed. She is not ill-appearing, toxic-appearing or diaphoretic.  HENT:     Head: Normocephalic and atraumatic.     Right Ear: Tympanic membrane and ear canal normal.     Left Ear: Tympanic membrane and ear canal normal.     Nose: Congestion and rhinorrhea present.     Mouth/Throat:     Pharynx: Oropharynx is clear.  Eyes:     Conjunctiva/sclera: Conjunctivae normal.  Neck:     Musculoskeletal: Normal range of motion and neck supple. No neck rigidity or muscular tenderness.  Cardiovascular:     Rate and Rhythm: Normal rate and regular rhythm.     Heart sounds: No murmur.  Pulmonary:     Effort: Pulmonary effort is normal. No respiratory distress.     Breath sounds: Normal breath sounds.  Musculoskeletal: Normal range of motion.  Lymphadenopathy:     Cervical: No cervical adenopathy.  Skin:    General: Skin is warm and dry.  Neurological:     Mental Status: She is alert.  Psychiatric:        Mood and Affect: Mood normal.      UC Treatments / Results  Labs (all labs ordered are listed, but  only abnormal results are displayed) Labs Reviewed - No data to display  EKG None  Radiology No results found.  Procedures Procedures (including critical care time)  Medications Ordered in UC Medications - No data to display  Initial Impression / Assessment and Plan / UC Course  I have reviewed the triage vital signs and the nursing notes.  Pertinent labs & imaging results that were available during my care of the patient were reviewed by me and considered in my medical decision making (see chart for details).     Symptoms consistent with viral illness Symptomatic treatment with over-the-counter medication as needed Follow up as needed for continued or worsening symptoms  Final Clinical Impressions(s) / UC Diagnoses   Final diagnoses:  Flu-like symptoms     Discharge Instructions     I believe this is a viral illness  You can try mucinex DM over the counter for cough and congestion Zyrtec D for drainage, post nasal drip and nasal congestion.  Rest, drink plenty of fluids Follow up as needed for continued or worsening symptoms     ED Prescriptions    None     Controlled Substance Prescriptions [place] Controlled Substance Registry consulted? Not Applicable   Janace Aris, NP 09/23/18 1451

## 2019-02-23 ENCOUNTER — Other Ambulatory Visit (HOSPITAL_COMMUNITY): Payer: Self-pay | Admitting: Family

## 2019-02-23 DIAGNOSIS — Z369 Encounter for antenatal screening, unspecified: Secondary | ICD-10-CM

## 2019-03-12 ENCOUNTER — Encounter (HOSPITAL_COMMUNITY): Payer: Self-pay | Admitting: *Deleted

## 2019-03-17 ENCOUNTER — Ambulatory Visit (HOSPITAL_COMMUNITY): Payer: BC Managed Care – PPO

## 2019-03-17 ENCOUNTER — Encounter (HOSPITAL_COMMUNITY): Payer: Self-pay

## 2019-03-17 ENCOUNTER — Ambulatory Visit (HOSPITAL_COMMUNITY)
Admission: RE | Admit: 2019-03-17 | Discharge: 2019-03-17 | Disposition: A | Discharge status: Home / Self Care | Payer: BC Managed Care – PPO | Source: Ambulatory Visit | Attending: Obstetrics and Gynecology | Admitting: Obstetrics and Gynecology

## 2019-03-17 ENCOUNTER — Other Ambulatory Visit: Payer: Self-pay

## 2019-03-17 ENCOUNTER — Ambulatory Visit (HOSPITAL_COMMUNITY): Payer: BC Managed Care – PPO | Admitting: *Deleted

## 2019-03-17 VITALS — BP 106/60 | HR 80 | Temp 98.8°F | Ht 61.0 in | Wt 128.6 lb

## 2019-03-17 DIAGNOSIS — Z3682 Encounter for antenatal screening for nuchal translucency: Secondary | ICD-10-CM | POA: Diagnosis not present

## 2019-03-17 DIAGNOSIS — Z369 Encounter for antenatal screening, unspecified: Secondary | ICD-10-CM | POA: Diagnosis not present

## 2019-03-17 DIAGNOSIS — Z3A13 13 weeks gestation of pregnancy: Secondary | ICD-10-CM | POA: Diagnosis not present

## 2019-03-17 DIAGNOSIS — O09211 Supervision of pregnancy with history of pre-term labor, first trimester: Secondary | ICD-10-CM | POA: Diagnosis not present

## 2019-03-19 ENCOUNTER — Telehealth (HOSPITAL_COMMUNITY): Payer: Self-pay | Admitting: *Deleted

## 2019-03-19 LAB — FIRST TRIMESTER SCREEN W/NT
CRL: 76.8 mm
DIA MoM: 0.41
DIA Value: 91.9 pg/mL
Gest Age-Collect: 13.4 weeks
Maternal Age At EDD: 33.1 yr
Nuchal Translucency MoM: 1.19
Nuchal Translucency: 2 mm
Number of Fetuses: 1
PAPP-A MoM: 1.32
PAPP-A Value: 2268.7 ng/mL
Test Results:: NEGATIVE
Weight: 128 [lb_av]
hCG MoM: 0.43
hCG Value: 37.4 IU/mL

## 2019-03-19 NOTE — Telephone Encounter (Signed)
Calling with results of First Trimester Screen, left message for patient to return call.

## 2019-03-22 ENCOUNTER — Telehealth (HOSPITAL_COMMUNITY): Payer: Self-pay | Admitting: *Deleted

## 2019-04-14 ENCOUNTER — Encounter
Admit: 2019-04-14 | Discharge: 2019-04-15 | Payer: PRIVATE HEALTH INSURANCE | Attending: Gastroenterology | Primary: Gastroenterology

## 2019-04-14 DIAGNOSIS — B181 Chronic viral hepatitis B without delta-agent: Secondary | ICD-10-CM

## 2019-04-14 MED ORDER — TENOFOVIR DISOPROXIL FUMARATE 300 MG TABLET
ORAL_TABLET | Freq: Every day | ORAL | 11 refills | 30 days | Status: CP
Start: 2019-04-14 — End: 2019-05-14

## 2019-04-30 ENCOUNTER — Inpatient Hospital Stay (HOSPITAL_COMMUNITY)
Admission: AD | Admit: 2019-04-30 | Discharge: 2019-04-30 | Disposition: A | Discharge status: Home / Self Care | Payer: BC Managed Care – PPO | Attending: Obstetrics & Gynecology | Admitting: Obstetrics & Gynecology

## 2019-04-30 ENCOUNTER — Other Ambulatory Visit: Payer: Self-pay

## 2019-04-30 ENCOUNTER — Encounter (HOSPITAL_COMMUNITY): Payer: Self-pay | Admitting: *Deleted

## 2019-04-30 DIAGNOSIS — M545 Low back pain: Secondary | ICD-10-CM | POA: Diagnosis not present

## 2019-04-30 DIAGNOSIS — O4692 Antepartum hemorrhage, unspecified, second trimester: Secondary | ICD-10-CM | POA: Diagnosis not present

## 2019-04-30 DIAGNOSIS — Z3A20 20 weeks gestation of pregnancy: Secondary | ICD-10-CM | POA: Insufficient documentation

## 2019-04-30 DIAGNOSIS — O26892 Other specified pregnancy related conditions, second trimester: Secondary | ICD-10-CM | POA: Diagnosis not present

## 2019-04-30 DIAGNOSIS — N930 Postcoital and contact bleeding: Secondary | ICD-10-CM | POA: Diagnosis not present

## 2019-04-30 LAB — URINALYSIS, ROUTINE W REFLEX MICROSCOPIC
Bilirubin Urine: NEGATIVE
Glucose, UA: NEGATIVE mg/dL
Hgb urine dipstick: NEGATIVE
Ketones, ur: NEGATIVE mg/dL
Leukocytes,Ua: NEGATIVE
Nitrite: NEGATIVE
Protein, ur: NEGATIVE mg/dL
Specific Gravity, Urine: 1.003 — ABNORMAL LOW (ref 1.005–1.030)
pH: 7 (ref 5.0–8.0)

## 2019-04-30 NOTE — MAU Provider Note (Addendum)
History     CSN: [id number]  Arrival date and time: 04/30/19 1145   First Provider Initiated Contact with Patient 04/30/19 1238      Chief Complaint  Patient presents with  . Vaginal Bleeding   HPI  Ms.  Michele Galvan is a 33 y.o. year old [id number] female at [redacted]w[redacted]d weeks gestation who presents to MAU reporting vaginal spotting after SI last night (04/29/19). She was evaluated for VB on 04/26/19, had an U/S yesterday and was told "everything was fine". She reports that the spotting stopped after a few times of wiping in the BR. She denies any spotting or VB today. She also reports lower back pain. She receives her [facility] with [facility]. She has a h/o chronic HepB.  Past Medical History:  Diagnosis Date  . Asthma   . 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 Hepatitis    Hep B  . Malpresentation of fetus, antepartum 04/22/2015   Breech on admission to labor and delivery   . NSVD (normal spontaneous vaginal delivery) 11/30/2013  . Post partum depression 04/22/2015    Past Surgical History:  Procedure Laterality Date  . NO PAST SURGERIES      Family History  Problem Relation Age of Onset  . Anemia Mother   . Hypertension Mother     Social History   Tobacco Use  . Smoking status: Never Smoker  . Smokeless tobacco: Never Used  Substance Use Topics  . Alcohol use: Not Currently    Alcohol/week: 0.0 standard drinks  . Drug use: No    Allergies:  Allergies  Allergen Reactions  . Latex Itching    Medications Prior to Admission  Medication Sig Dispense Refill Last Dose  . albuterol (VENTOLIN) (5 MG/ML) 0.5% NEBU Take by nebulization continuous.   Past Month at Unknown time  . Prenatal Vit-Fe Fumarate-FA (PRENATAL VITAMIN PO) Take by mouth.   04/29/2019 at Unknown time  . tenofovir (VIREAD) 300 MG tablet Take 300 mg by mouth at bedtime.   04/29/2019 at Unknown time    Review of Systems  Constitutional: Negative.   HENT: Negative.   Eyes: Negative.    Respiratory: Negative.   Cardiovascular: Negative.   Gastrointestinal: Negative.   Endocrine: Negative.   Genitourinary: Negative.  Negative for vaginal bleeding.  Musculoskeletal: Positive for back pain.  Skin: Negative.   Allergic/Immunologic: Negative.   Neurological: Negative.   Hematological: Negative.   Psychiatric/Behavioral: Negative.    Physical Exam   Blood pressure 120/61, pulse 85, temperature 98 F (36.7 C), resp. rate 16, weight 61.7 kg, last menstrual period 12/10/2018.  Physical Exam  Nursing note and vitals reviewed. Constitutional: She is oriented to person, place, and time. She appears well-developed and well-nourished.  HENT:  Head: Normocephalic and atraumatic.  Eyes: Pupils are equal, round, and reactive to light.  Neck: Normal range of motion.  Cardiovascular: Normal rate.  Respiratory: Effort normal.  GI: Soft.  Genitourinary:    Genitourinary Comments: Speculum Exam: scant amount of light tannish pink blood in vaginal vault, cleared out with large cotton swab   Musculoskeletal: Normal range of motion.  Neurological: She is alert and oriented to person, place, and time.  Skin: Skin is warm and dry.  Psychiatric: She has a normal mood and affect. Her behavior is normal. Judgment and thought content normal.    MAU Course  Procedures  Patient informed that the ultrasound is considered a limited OB ultrasound and is not intended to be a  complete ultrasound exam.  Patient also informed that the ultrasound is not being completed with the intent of assessing for fetal or placental anomalies or any pelvic abnormalities.  Explained that the purpose of today's ultrasound is to assess for viability.  Baby was found to be very active. Picture printed and given to patient. Patient very reassured once she saw baby on U/S. Patient acknowledges the purpose of the exam and the limitations of the study.   MDM CCUA Speculum Exam Informal BS U/S  Results for orders  placed or performed during the hospital encounter of 04/30/19 (from the past 24 hour(s))  Urinalysis, Routine w reflex microscopic     Status: Abnormal   Collection Time: 04/30/19 12:03 PM  Result Value Ref Range   Color, Urine STRAW (A) YELLOW   APPearance CLEAR CLEAR   Specific Gravity, Urine 1.003 (L) 1.005 - 1.030   pH 7.0 5.0 - 8.0   Glucose, UA NEGATIVE NEGATIVE mg/dL   Hgb urine dipstick NEGATIVE NEGATIVE   Bilirubin Urine NEGATIVE NEGATIVE   Ketones, ur NEGATIVE NEGATIVE mg/dL   Protein, ur NEGATIVE NEGATIVE mg/dL   Nitrite NEGATIVE NEGATIVE   Leukocytes,Ua NEGATIVE NEGATIVE     Assessment and Plan  PCB (post coital bleeding) - Reassurance given that spotting is not unusual after SI - Advised to be on pelvic rest until further notice - Information provided on VB in pregnancy - Discharge home - Keep scheduled appt with [facility] - Patient verbalized an understanding of the plan of care and agrees.   Laury Deep, MSN, CNM 04/30/2019, 12:38 PM

## 2019-04-30 NOTE — MAU Note (Signed)
.   Michele Galvan is a 33 y.o. at [redacted]w[redacted]d here in MAU reporting: that she had vaginal bleeding that started on Monday and she had an U/S yesterday and they told her everything was fine, After intercourse last night she had vaginal bleeding again. Denies any bleeding now. Lower back pain  Onset of complaint: Monday  Pain score: 3 Vitals:   04/30/19 1158  BP: 120/61  Pulse: 85  Resp: 16  Temp: 98 F (36.7 C)     FHT:[id number] Lab orders placed from triage: UA

## 2019-09-06 ENCOUNTER — Other Ambulatory Visit: Payer: Self-pay

## 2019-09-06 ENCOUNTER — Inpatient Hospital Stay (HOSPITAL_COMMUNITY)
Admission: AD | Admit: 2019-09-06 | Discharge: 2019-09-07 | Disposition: A | Discharge status: Home / Self Care | Payer: BC Managed Care – PPO | Attending: Obstetrics & Gynecology | Admitting: Obstetrics & Gynecology

## 2019-09-06 ENCOUNTER — Encounter (HOSPITAL_COMMUNITY): Payer: Self-pay | Admitting: Obstetrics & Gynecology

## 2019-09-06 DIAGNOSIS — O471 False labor at or after 37 completed weeks of gestation: Secondary | ICD-10-CM | POA: Insufficient documentation

## 2019-09-06 DIAGNOSIS — N930 Postcoital and contact bleeding: Secondary | ICD-10-CM

## 2019-09-06 DIAGNOSIS — O26853 Spotting complicating pregnancy, third trimester: Secondary | ICD-10-CM | POA: Insufficient documentation

## 2019-09-06 DIAGNOSIS — B373 Candidiasis of vulva and vagina: Secondary | ICD-10-CM | POA: Insufficient documentation

## 2019-09-06 DIAGNOSIS — Z3A38 38 weeks gestation of pregnancy: Secondary | ICD-10-CM | POA: Insufficient documentation

## 2019-09-06 DIAGNOSIS — O98813 Other maternal infectious and parasitic diseases complicating pregnancy, third trimester: Secondary | ICD-10-CM | POA: Insufficient documentation

## 2019-09-06 NOTE — MAU Note (Signed)
Patient reports contractions 10 minutes apart.  Also had some spotting this morning.  Denies LOF.  Endorses + FM.

## 2019-09-07 DIAGNOSIS — Z3A38 38 weeks gestation of pregnancy: Secondary | ICD-10-CM | POA: Diagnosis not present

## 2019-09-07 DIAGNOSIS — O26853 Spotting complicating pregnancy, third trimester: Secondary | ICD-10-CM | POA: Diagnosis not present

## 2019-09-07 DIAGNOSIS — O98813 Other maternal infectious and parasitic diseases complicating pregnancy, third trimester: Secondary | ICD-10-CM | POA: Diagnosis not present

## 2019-09-07 DIAGNOSIS — B373 Candidiasis of vulva and vagina: Secondary | ICD-10-CM | POA: Diagnosis not present

## 2019-09-07 DIAGNOSIS — O471 False labor at or after 37 completed weeks of gestation: Secondary | ICD-10-CM | POA: Diagnosis present

## 2019-09-07 NOTE — Discharge Summary (Signed)
MAU Discharge  S: Ms. Ramonita Koenig is a 33 y.o. [phone] at [redacted]w[redacted]d  who presents to MAU today complaining contractions q 8-10 minutes. She presents with some vaginal spotting this AM and has since been treated for a yeast infection. She denies LOF. She reports normal fetal movement.    O: BP 113/79   Pulse 92   Temp 98.9 F (37.2 C)   Resp 19   Wt 71.3 kg   LMP 12/10/2018 (Exact Date)   BMI 29.71 kg/m    Cervical exam:  Dilation: 2 Effacement (%): 50 Cervical Position: Middle Station: Ballotable Presentation: Vertex Exam by:: [employer] Flippin RN    Fetal Monitoring: Baseline: 150 Variability: Moderate Accelerations: Reactive  Decelerations: Absent Contractions: 8-55mins   A: SIUP at [redacted]w[redacted]d  False labor  P: The patient was monitored for over an hour and a reassuring FHT was displayed. She did not show any chnage on cervical check after one hour. She is provided return precautions including more bleeding and an increase in the regularity, pain and frequency of her contractions.    Honor Loh, DO 09/07/2019 12:22 AM

## 2020-02-14 ENCOUNTER — Ambulatory Visit
Admit: 2020-02-14 | Discharge: 2020-02-15 | Payer: PRIVATE HEALTH INSURANCE | Attending: Gastroenterology | Primary: Gastroenterology

## 2020-02-14 DIAGNOSIS — B181 Chronic viral hepatitis B without delta-agent: Principal | ICD-10-CM

## 2020-02-14 DIAGNOSIS — Z5181 Encounter for therapeutic drug level monitoring: Principal | ICD-10-CM

## 2020-02-14 DIAGNOSIS — Z3200 Encounter for pregnancy test, result unknown: Principal | ICD-10-CM

## 2020-02-14 MED ORDER — TENOFOVIR DISOPROXIL FUMARATE 300 MG TABLET
ORAL_TABLET | Freq: Every day | ORAL | 11 refills | 30.00000 days | Status: CP
Start: 2020-02-14 — End: ?

## 2020-03-21 ENCOUNTER — Ambulatory Visit: Admit: 2020-03-21 | Discharge: 2020-03-22 | Payer: PRIVATE HEALTH INSURANCE

## 2020-03-24 DIAGNOSIS — B181 Chronic viral hepatitis B without delta-agent: Principal | ICD-10-CM

## 2020-04-24 ENCOUNTER — Other Ambulatory Visit: Payer: Self-pay | Admitting: Physician Assistant

## 2020-04-24 DIAGNOSIS — N63 Unspecified lump in unspecified breast: Secondary | ICD-10-CM

## 2020-07-04 ENCOUNTER — Ambulatory Visit
Admission: RE | Admit: 2020-07-04 | Discharge: 2020-07-04 | Disposition: A | Discharge status: Home / Self Care | Payer: BC Managed Care – PPO | Source: Ambulatory Visit | Attending: Physician Assistant | Admitting: Physician Assistant

## 2020-07-04 ENCOUNTER — Other Ambulatory Visit: Payer: Self-pay

## 2020-07-04 ENCOUNTER — Other Ambulatory Visit: Payer: Self-pay | Admitting: Physician Assistant

## 2020-07-04 ENCOUNTER — Ambulatory Visit
Admission: RE | Admit: 2020-07-04 | Discharge: 2020-07-04 | Disposition: A | Discharge status: Home / Self Care | Payer: Medicaid Other | Source: Ambulatory Visit | Attending: Physician Assistant | Admitting: Physician Assistant

## 2020-07-04 DIAGNOSIS — N63 Unspecified lump in unspecified breast: Secondary | ICD-10-CM

## 2020-08-31 ENCOUNTER — Encounter: Admit: 2020-08-31 | Discharge: 2020-08-31 | Payer: PRIVATE HEALTH INSURANCE | Attending: Family | Primary: Family

## 2020-08-31 ENCOUNTER — Encounter: Admit: 2020-08-31 | Discharge: 2020-08-31 | Payer: PRIVATE HEALTH INSURANCE

## 2020-08-31 DIAGNOSIS — Z5181 Encounter for therapeutic drug level monitoring: Principal | ICD-10-CM

## 2020-08-31 DIAGNOSIS — K824 Cholesterolosis of gallbladder: Principal | ICD-10-CM

## 2020-08-31 DIAGNOSIS — J45909 Unspecified asthma, uncomplicated: Principal | ICD-10-CM

## 2020-08-31 DIAGNOSIS — F32A Depression, unspecified: Principal | ICD-10-CM

## 2020-08-31 DIAGNOSIS — B181 Chronic viral hepatitis B without delta-agent: Principal | ICD-10-CM

## 2020-08-31 DIAGNOSIS — Z13820 Encounter for screening for osteoporosis: Principal | ICD-10-CM

## 2020-08-31 MED ORDER — VEMLIDY 25 MG TABLET
ORAL_TABLET | ORAL | 11 refills | 0.00000 days | Status: CP
Start: 2020-08-31 — End: ?

## 2020-09-01 DIAGNOSIS — B181 Chronic viral hepatitis B without delta-agent: Principal | ICD-10-CM

## 2020-10-10 DIAGNOSIS — Z3200 Encounter for pregnancy test, result unknown: Principal | ICD-10-CM

## 2020-12-24 DIAGNOSIS — B181 Chronic viral hepatitis B without delta-agent: Principal | ICD-10-CM

## 2020-12-26 DIAGNOSIS — B181 Chronic viral hepatitis B without delta-agent: Principal | ICD-10-CM

## 2020-12-26 MED ORDER — TENOFOVIR DISOPROXIL FUMARATE 300 MG TABLET
ORAL_TABLET | Freq: Every day | ORAL | 11 refills | 30 days | Status: CP
Start: 2020-12-26 — End: ?

## 2021-01-03 ENCOUNTER — Other Ambulatory Visit: Payer: Self-pay | Admitting: Physician Assistant

## 2021-01-03 ENCOUNTER — Ambulatory Visit
Admission: RE | Admit: 2021-01-03 | Discharge: 2021-01-03 | Disposition: A | Discharge status: Home / Self Care | Payer: Medicaid Other | Source: Ambulatory Visit | Attending: Physician Assistant | Admitting: Physician Assistant

## 2021-01-03 ENCOUNTER — Other Ambulatory Visit: Payer: Self-pay

## 2021-01-03 DIAGNOSIS — N63 Unspecified lump in unspecified breast: Secondary | ICD-10-CM

## 2021-01-11 DIAGNOSIS — B181 Chronic viral hepatitis B without delta-agent: Principal | ICD-10-CM

## 2021-01-11 MED ORDER — TENOFOVIR DISOPROXIL FUMARATE 300 MG TABLET
ORAL_TABLET | Freq: Every day | ORAL | 11 refills | 30 days | Status: CP
Start: 2021-01-11 — End: ?

## 2021-01-11 NOTE — Unmapped (Signed)
[facility] Specialty Medication Onboarding    Specialty Medication: tenofovir disoproxil 300 mg tablet  Prior Authorization: Not Required   Financial Assistance: No - copay  <$25  Final Copay/Day Supply: $3 / 30    Insurance Restrictions: None     Notes to Pharmacist: n/a    The triage team has completed the benefits investigation and has determined that the patient is able to fill this medication at [facility]. Please contact the patient to complete the onboarding or follow up with the prescribing physician as needed.

## 2021-01-11 NOTE — Unmapped (Signed)
Specialty Medication(s): tenofovir disoproxil fumarate 300mg     Wanda Carr has been dis-enrolled from the [facility] specialty pharmacy services due to a pharmacy change. The patient is now filling at [facility] in [place] on [address]: listed in her [employer].    Additional information provided to the patient: none    Oneill Bais [facility]  [facility] Specialty Pharmacist

## 2021-01-12 NOTE — Unmapped (Signed)
New RX sent to her local pharmacy as requested for Viread 300 mg, one tablet daily #30 with 11 refills.

## 2021-03-20 DIAGNOSIS — B181 Chronic viral hepatitis B without delta-agent: Principal | ICD-10-CM

## 2021-03-20 DIAGNOSIS — Z1289 Encounter for screening for malignant neoplasm of other sites: Principal | ICD-10-CM

## 2021-07-06 ENCOUNTER — Inpatient Hospital Stay: Admission: RE | Admit: 2021-07-06 | Payer: Medicaid Other | Source: Ambulatory Visit

## 2021-08-14 IMAGING — US US BREAST*L* LIMITED INC AXILLA
1 series · 5 of 5 positions shown · non-contrast
Comparison: None.

CLINICAL DATA: Patient describes a palpable lump in the LEFT
breast. This is patient's first mammogram.

EXAM:
DIGITAL DIAGNOSTIC BILATERAL MAMMOGRAM WITH CAD AND TOMO
ULTRASOUND LEFT BREAST

[Series 1: us breast*left* limited inc axilla · 0.05mm/px · 5 of 5 slices shown]
[im 1/5]
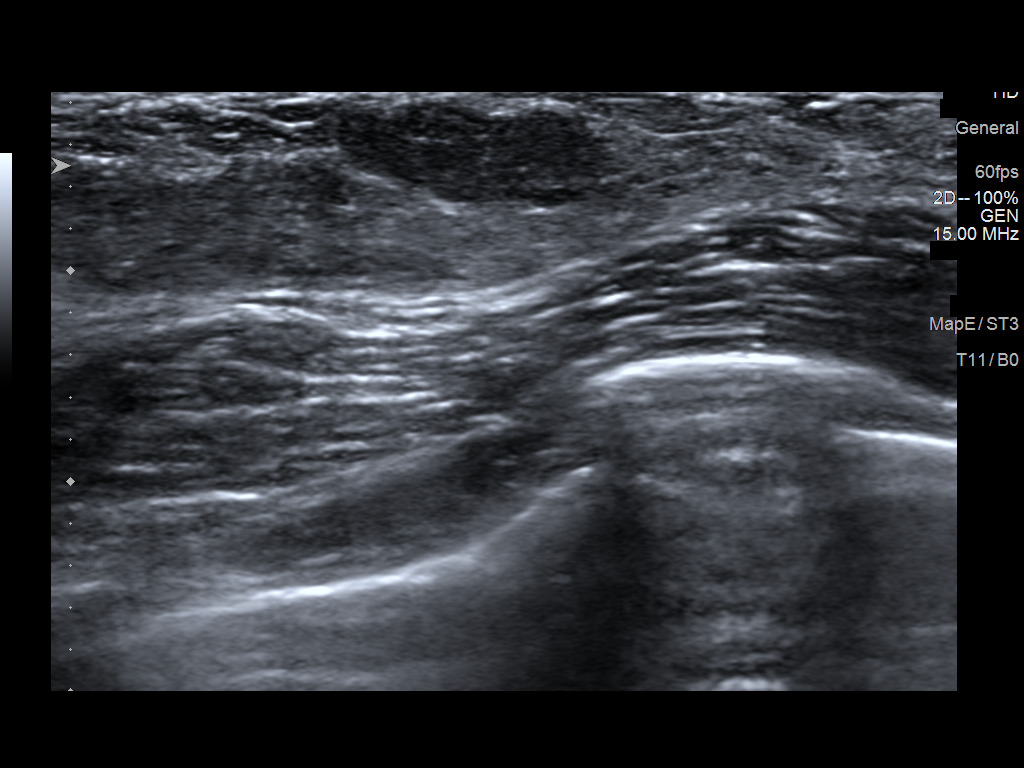
[im 2/5]
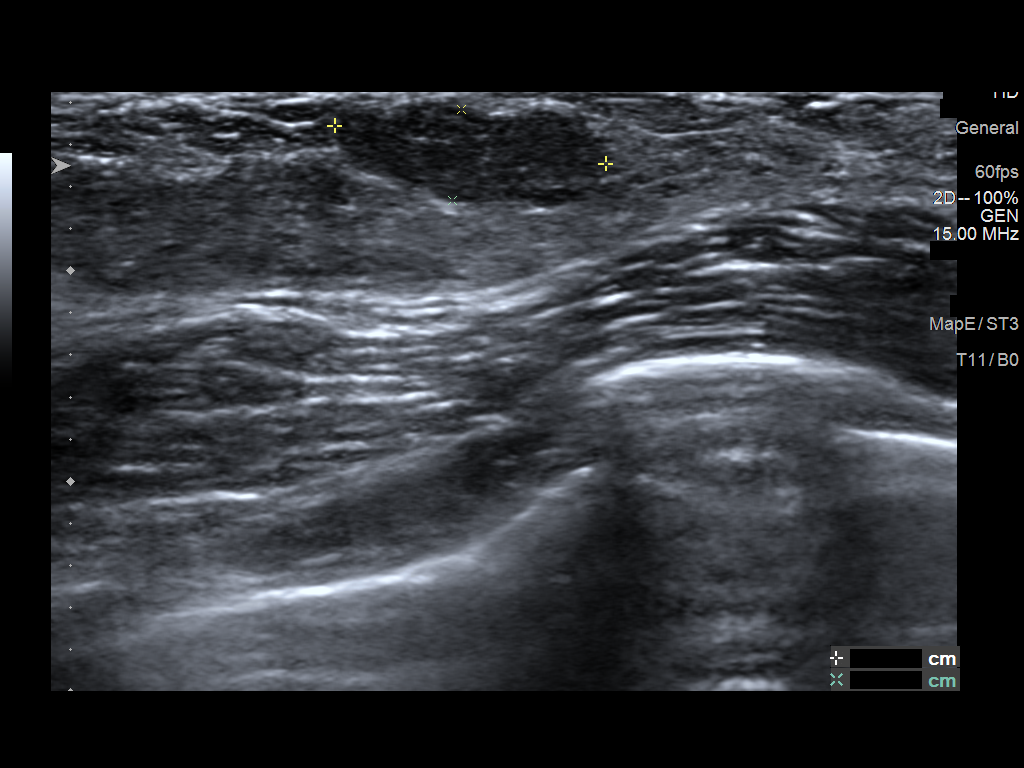
[im 3/5]
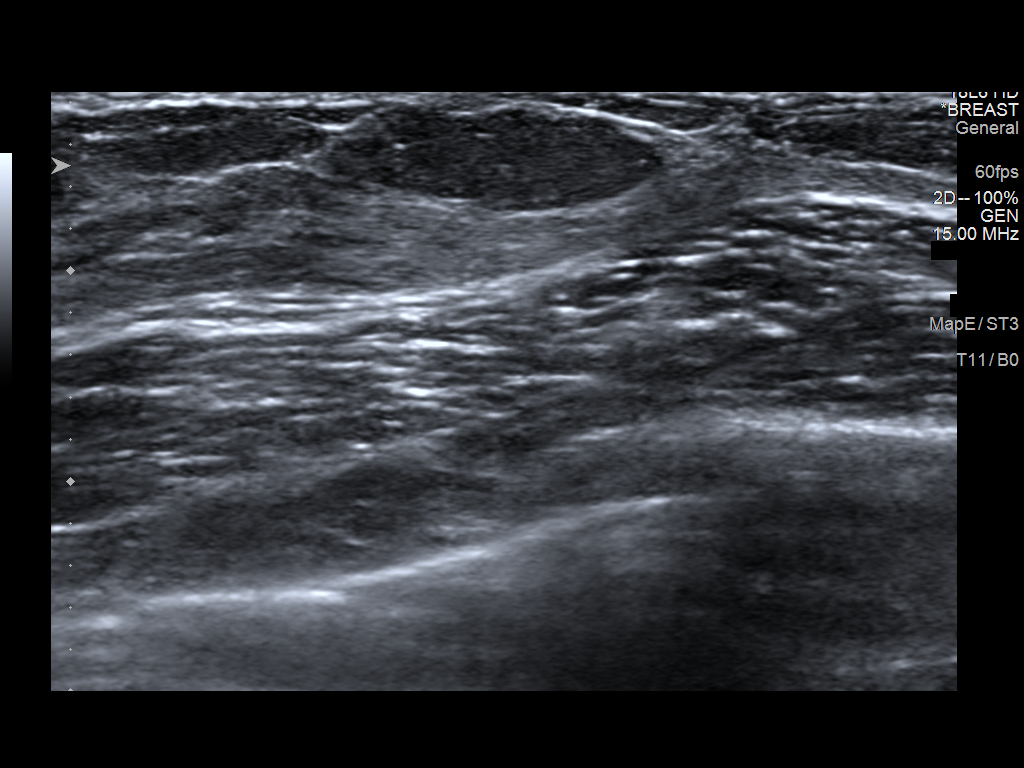
[im 4/5]
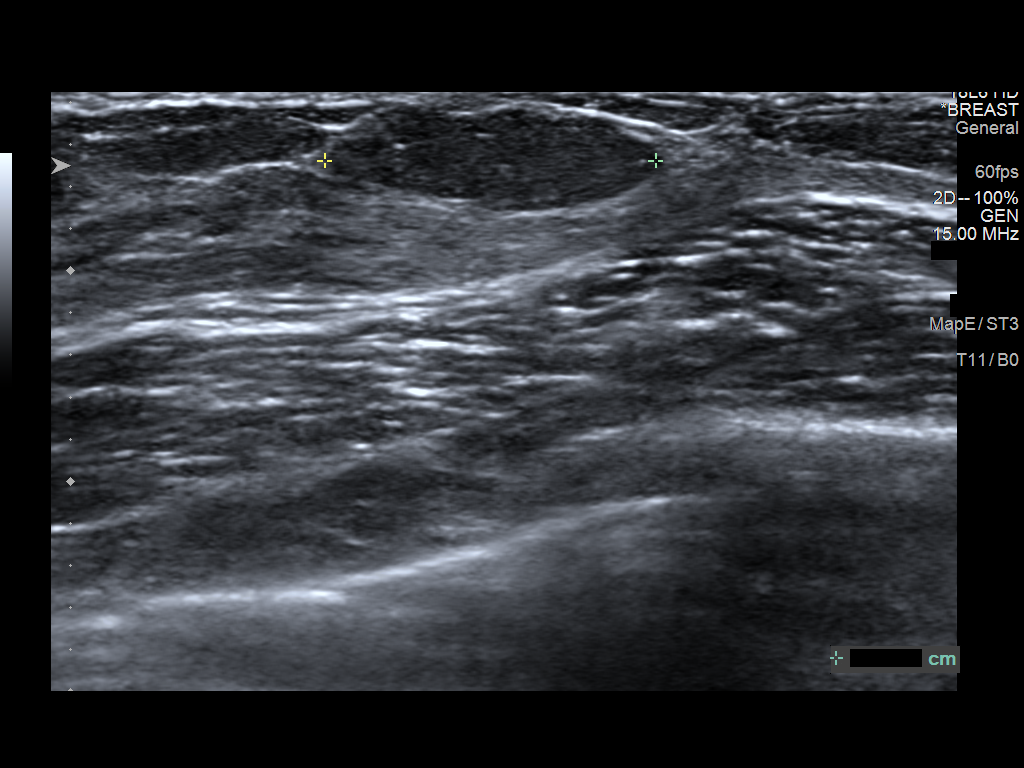
[im 5/5]
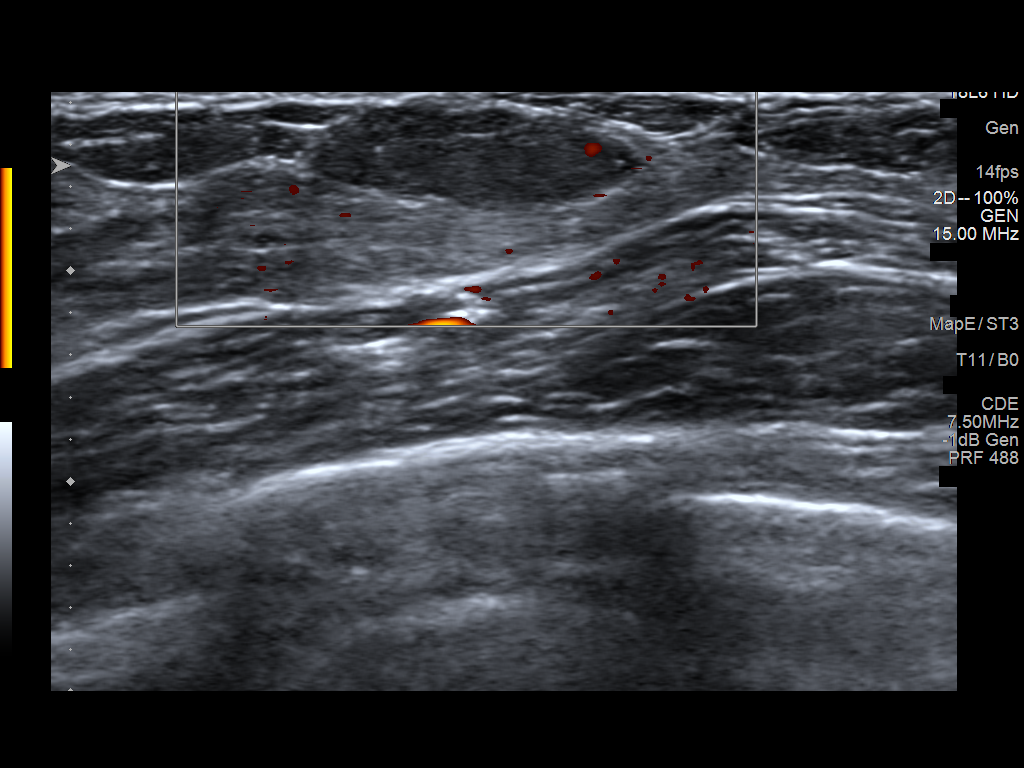

[5 of 5 positions shown; findings below may reference images not displayed]

ACR Breast Density Category c: The breast tissue is heterogeneously
dense, which may obscure small masses.
FINDINGS: There are no dominant masses, suspicious calcifications or secondary
signs of malignancy within either breast.

Mammographic images were processed with CAD.

Targeted ultrasound is performed, showing an oval circumscribed
hypoechoic mass in the LEFT breast at the 2 o'clock axis, 6 cm from
the nipple, measuring 1.6 x 0.4 x 1.3 cm, with internal vascularity,
corresponding to the palpable area of concern.
IMPRESSION: 1. Probably benign fibroadenoma within the LEFT breast at the 2
o'clock axis, 6 cm from the nipple, measuring 1.6 x 0.4 x 1.3 cm,
corresponding to the palpable area of concern. Recommend follow-up
LEFT breast ultrasound in 6 months to ensure stability.
2. No evidence of malignancy within the RIGHT breast.

RECOMMENDATION:
LEFT breast ultrasound in 6 months.

I have discussed the findings and recommendations with the patient.
If applicable, a reminder letter will be sent to the patient
regarding the next appointment.

BI-RADS CATEGORY  3: Probably benign.

## 2021-08-14 IMAGING — MG DIGITAL DIAGNOSTIC BILAT W/ TOMO W/ CAD
6 of 12 series · 6 of 36 positions shown · non-contrast
Comparison: None.

CLINICAL DATA: Patient describes a palpable lump in the LEFT
breast. This is patient's first mammogram.

EXAM:
DIGITAL DIAGNOSTIC BILATERAL MAMMOGRAM WITH CAD AND TOMO
ULTRASOUND LEFT BREAST

[R CC synth-2D]
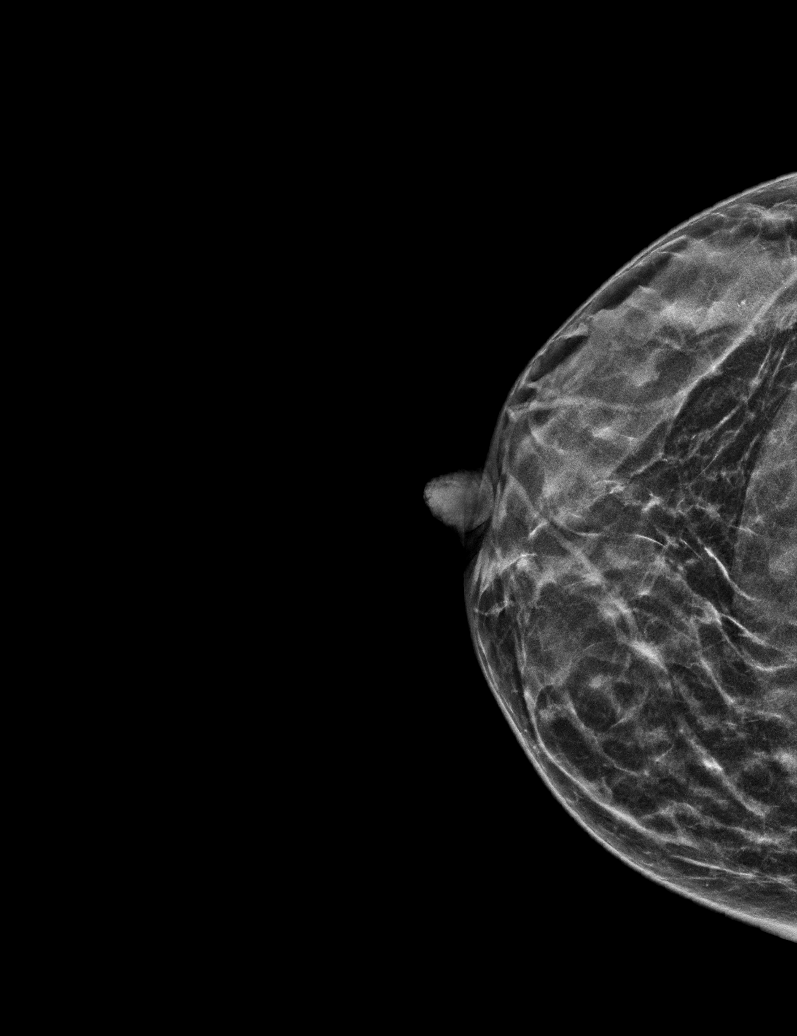

[L CC synth-2D (1 of 2)]
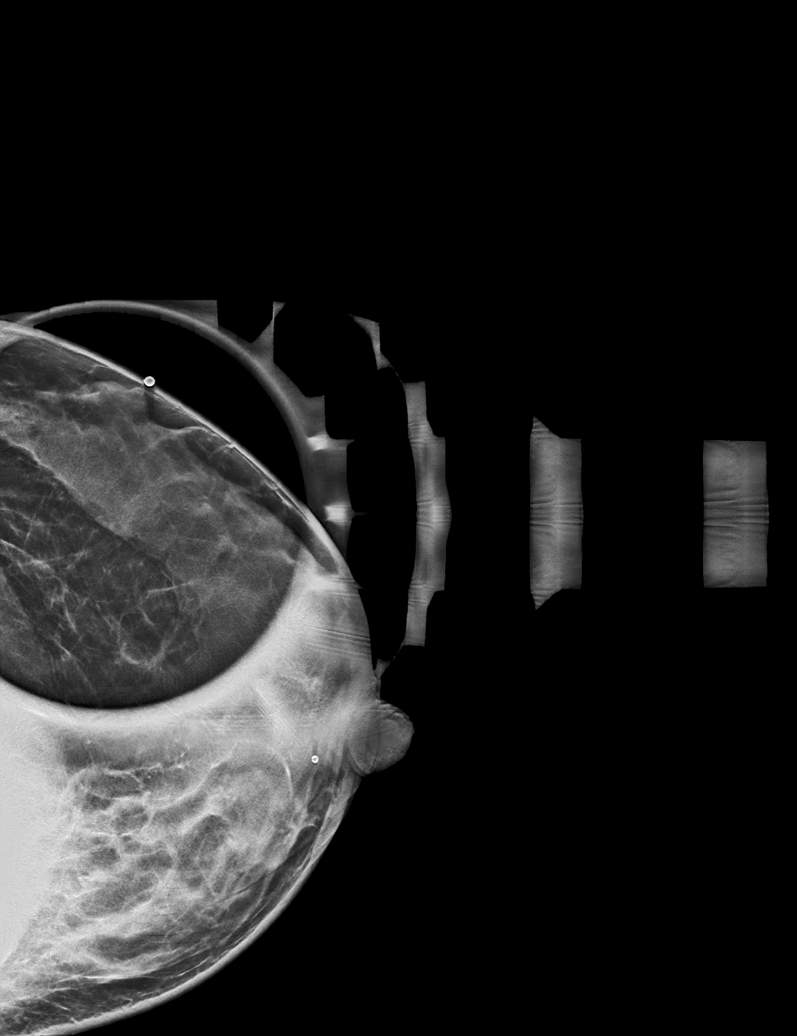

[R XCCL synth-2D]
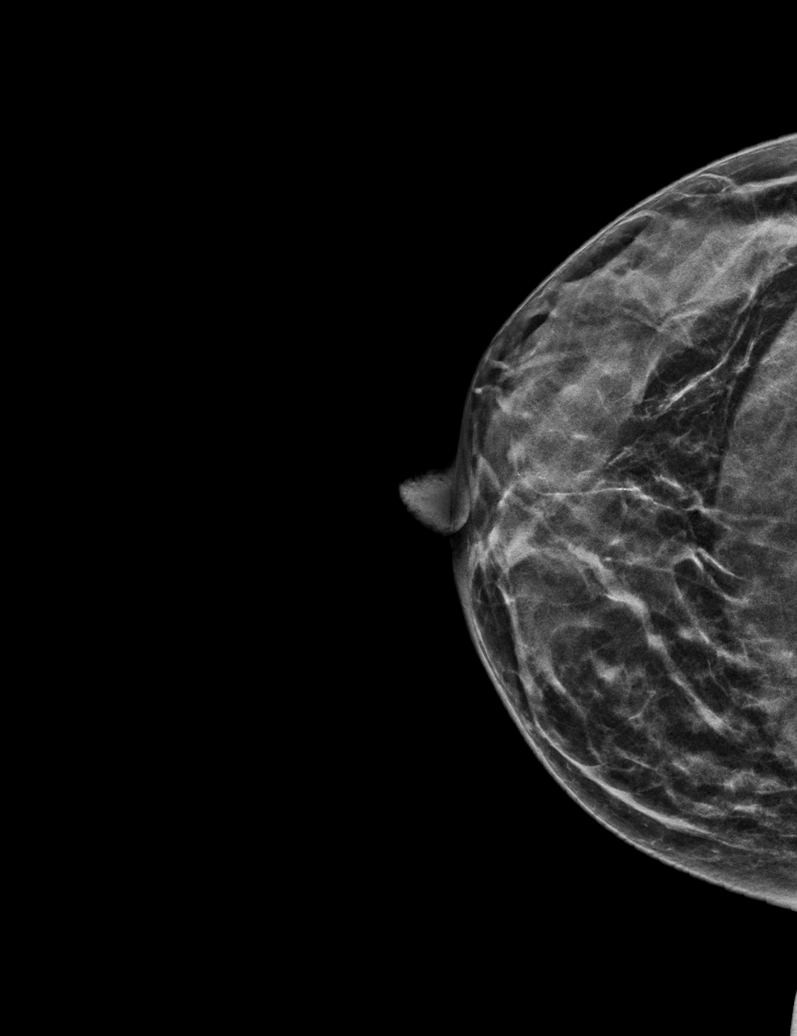

[R MLO synth-2D]
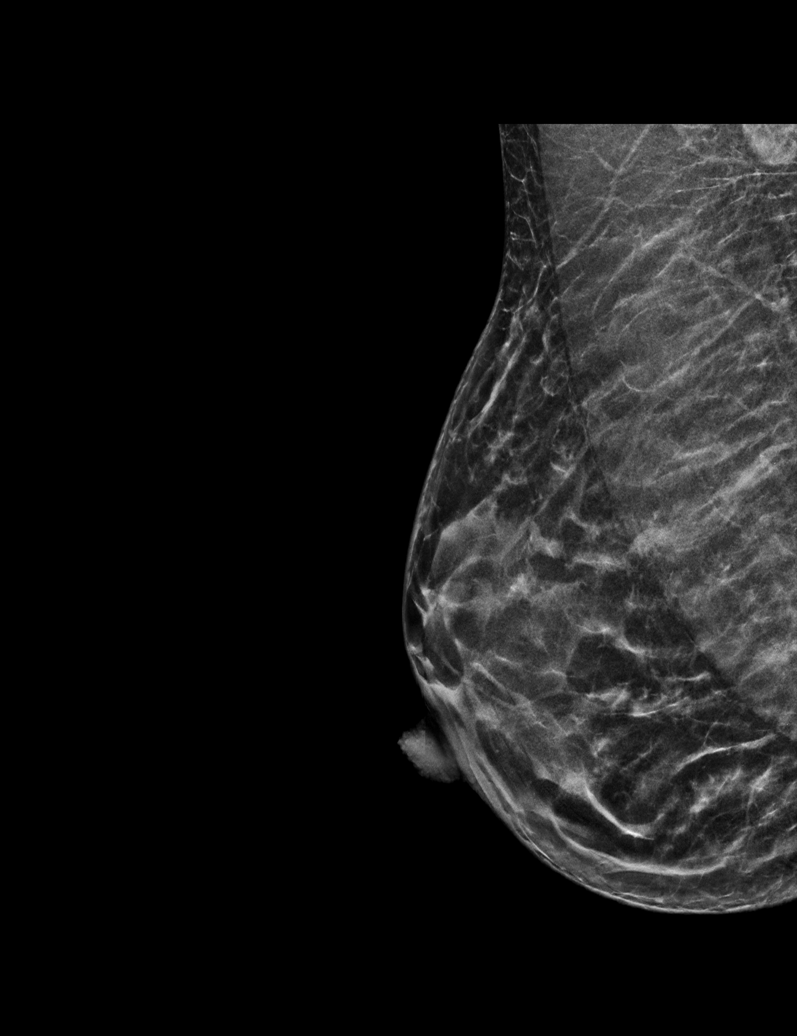

[L MLO synth-2D]
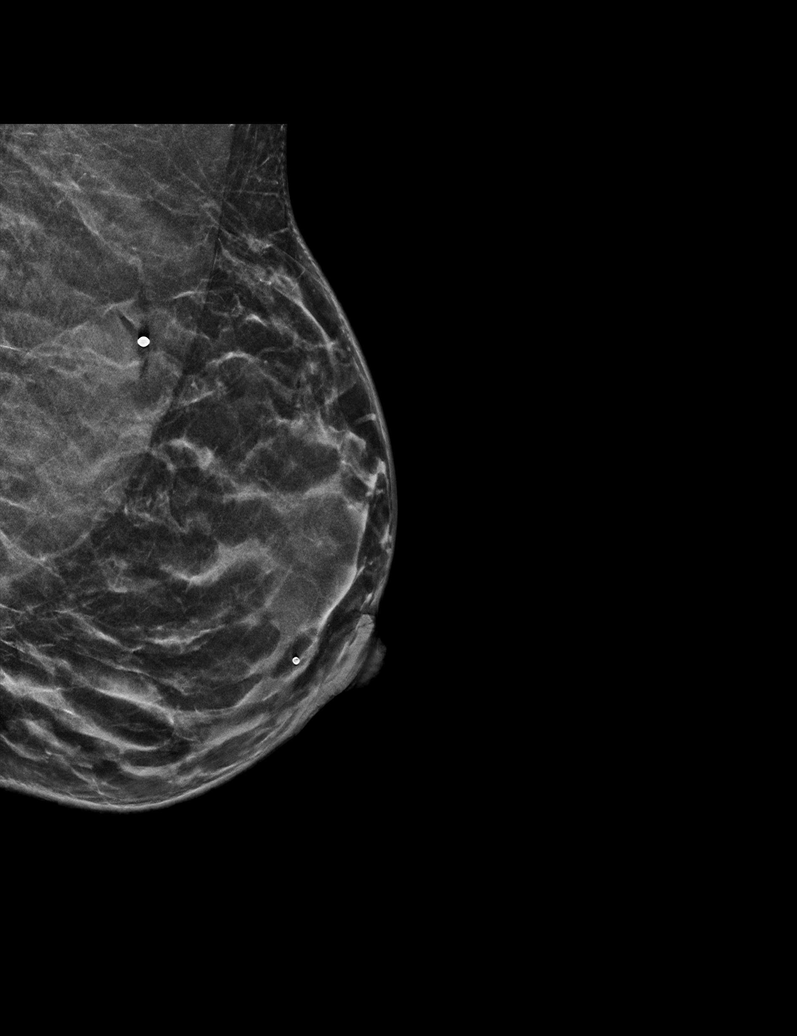

[L CC synth-2D (2 of 2)]
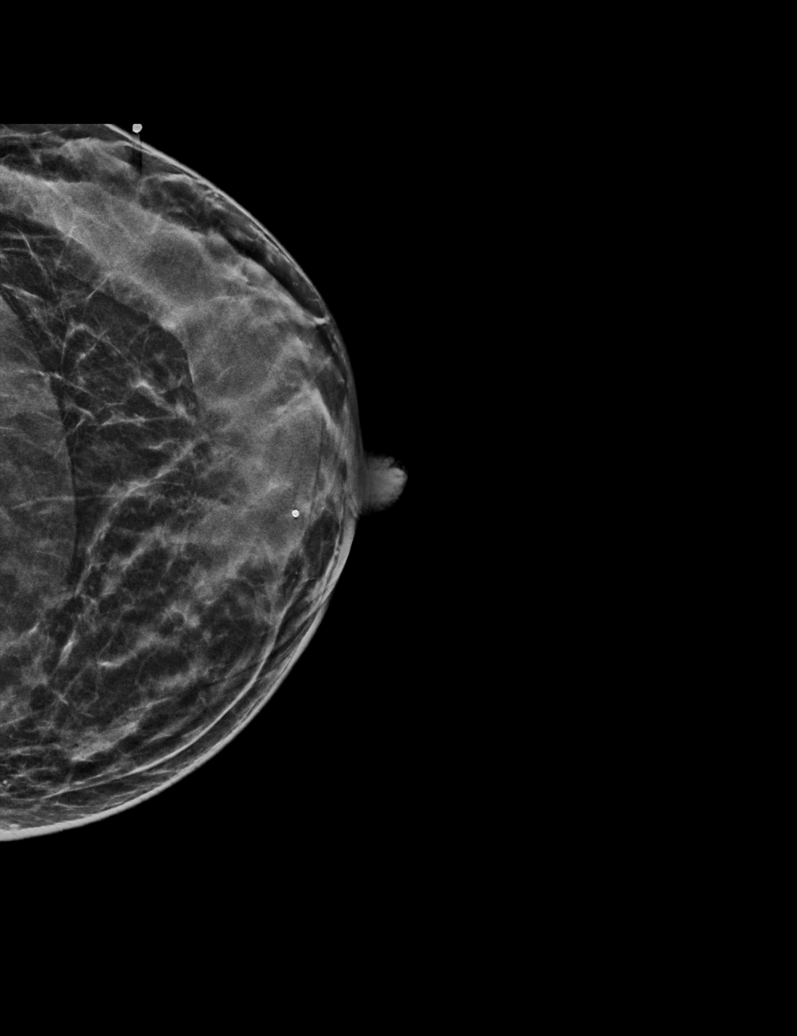

[6 of 36 positions shown; findings below may reference images not displayed]

ACR Breast Density Category c: The breast tissue is heterogeneously
dense, which may obscure small masses.
FINDINGS: There are no dominant masses, suspicious calcifications or secondary
signs of malignancy within either breast.

Mammographic images were processed with CAD.

Targeted ultrasound is performed, showing an oval circumscribed
hypoechoic mass in the LEFT breast at the 2 o'clock axis, 6 cm from
the nipple, measuring 1.6 x 0.4 x 1.3 cm, with internal vascularity,
corresponding to the palpable area of concern.
IMPRESSION: 1. Probably benign fibroadenoma within the LEFT breast at the 2
o'clock axis, 6 cm from the nipple, measuring 1.6 x 0.4 x 1.3 cm,
corresponding to the palpable area of concern. Recommend follow-up
LEFT breast ultrasound in 6 months to ensure stability.
2. No evidence of malignancy within the RIGHT breast.

RECOMMENDATION:
LEFT breast ultrasound in 6 months.

I have discussed the findings and recommendations with the patient.
If applicable, a reminder letter will be sent to the patient
regarding the next appointment.

BI-RADS CATEGORY  3: Probably benign.

## 2021-12-17 ENCOUNTER — Ambulatory Visit
Admit: 2021-12-17 | Discharge: 2021-12-17 | Payer: PRIVATE HEALTH INSURANCE | Attending: Gastroenterology | Primary: Gastroenterology

## 2021-12-17 ENCOUNTER — Ambulatory Visit: Admit: 2021-12-17 | Discharge: 2021-12-17 | Payer: PRIVATE HEALTH INSURANCE

## 2021-12-17 DIAGNOSIS — B181 Chronic viral hepatitis B without delta-agent: Principal | ICD-10-CM

## 2021-12-23 MED ORDER — TENOFOVIR DISOPROXIL FUMARATE 300 MG TABLET
ORAL_TABLET | Freq: Every day | ORAL | 11 refills | 30 days | Status: CP
Start: 2021-12-23 — End: ?

## 2022-01-07 NOTE — Telephone Encounter (Signed)
Close encounter 

## 2022-02-13 IMAGING — US US BREAST*L* LIMITED INC AXILLA
1 series · 5 of 5 positions shown · non-contrast
Comparison: Previous exam(s).

CLINICAL DATA: 35-year-old female presenting for first six-month
follow-up of a probably benign left breast mass.

EXAM:
ULTRASOUND OF THE LEFT BREAST

[Series 1: us breast*left* limited inc axilla · 0.05mm/px · 5 of 5 slices shown]
[im 1/5]
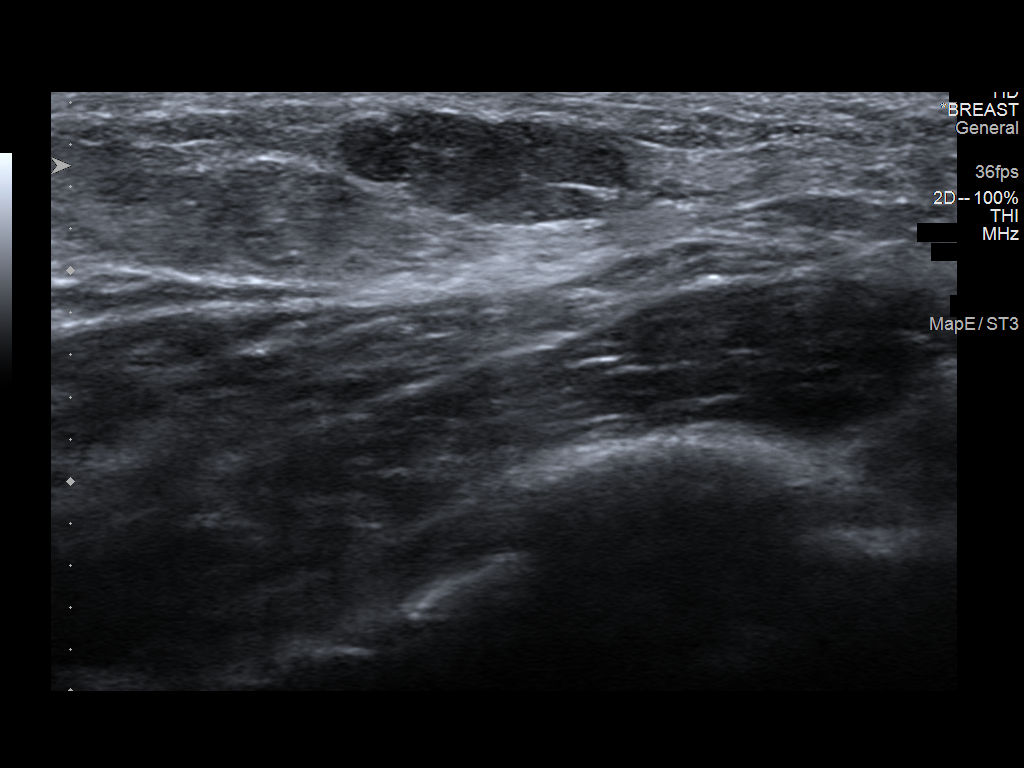
[im 2/5]
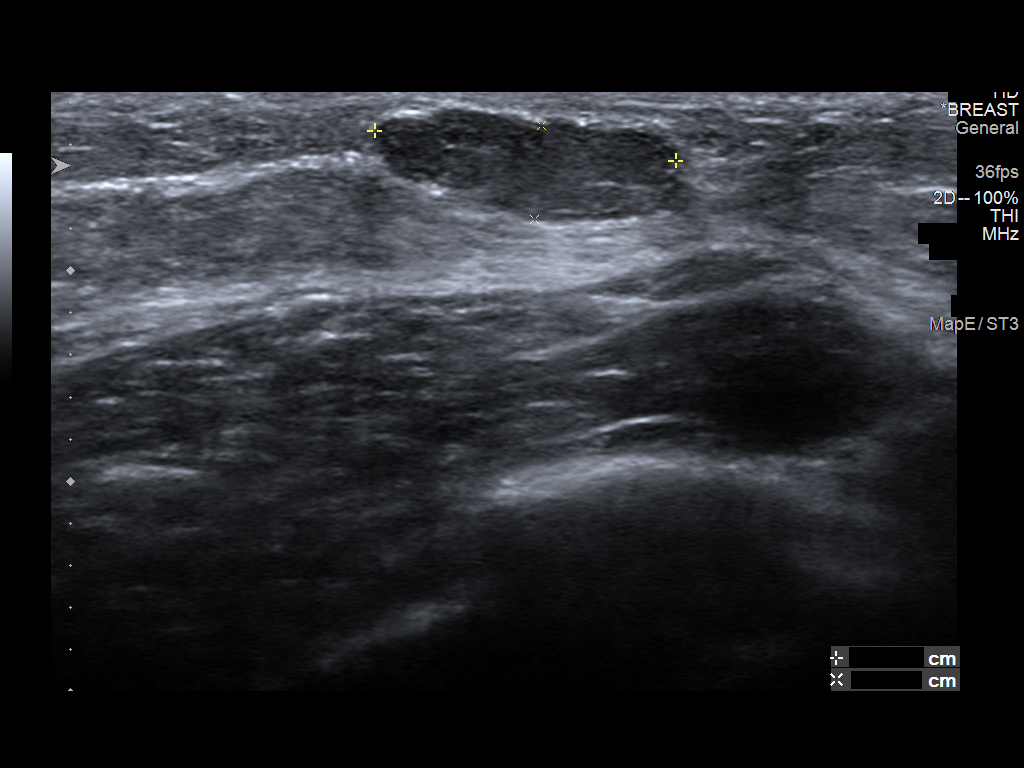
[im 3/5]
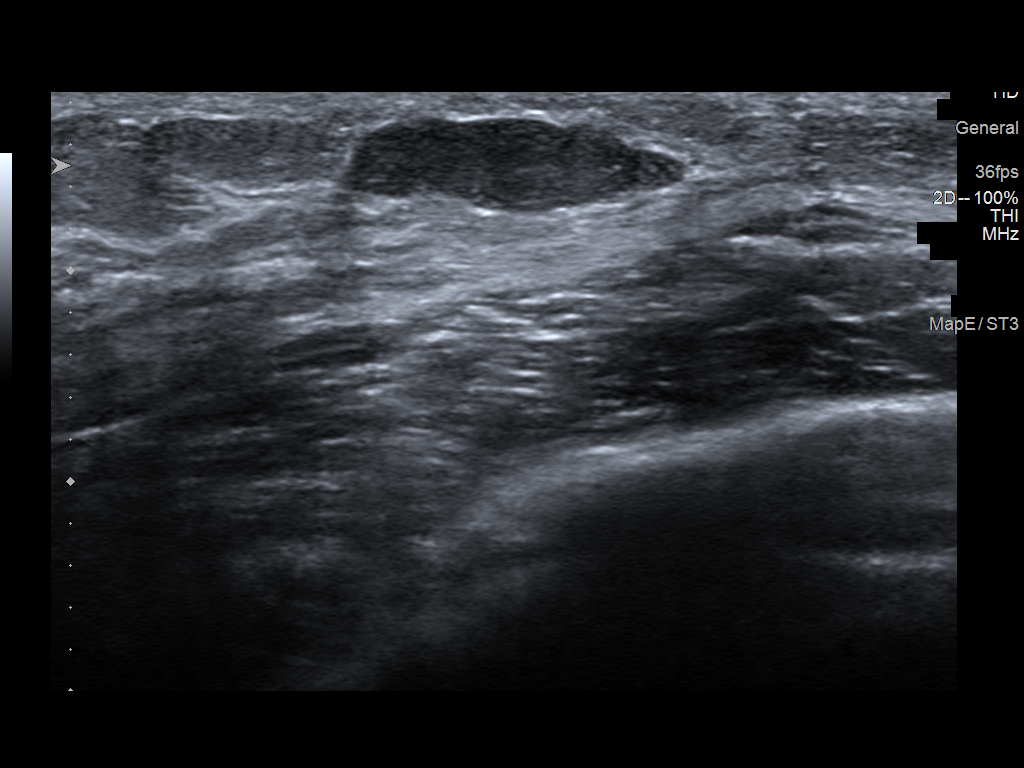
[im 4/5]
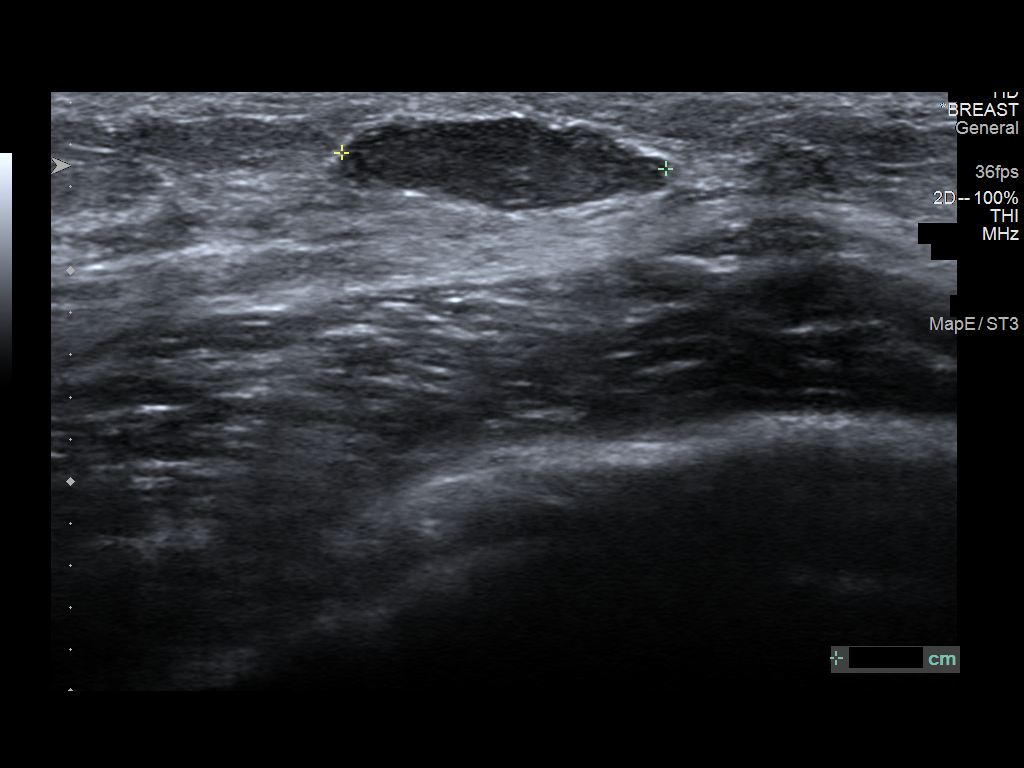
[im 5/5]
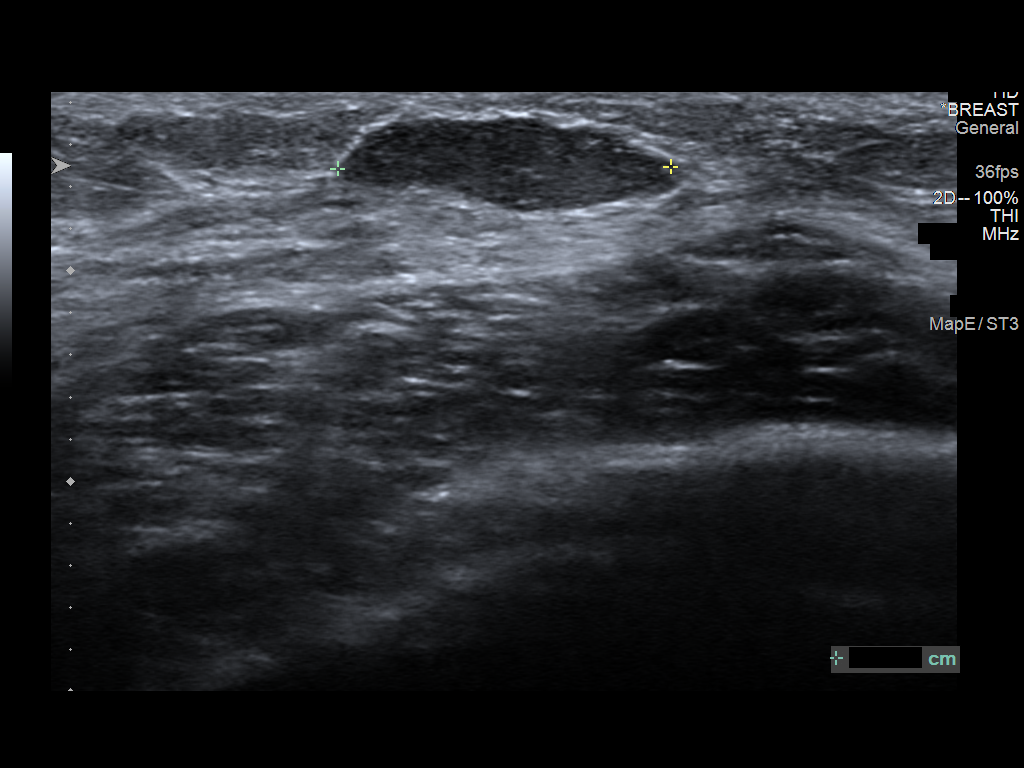

[5 of 5 positions shown; findings below may reference images not displayed]

FINDINGS: Targeted ultrasound is performed, showing stable appearance of an
oval, circumscribed hypoechoic mass at the 2 o'clock position 6 cm
from the nipple on the left. It measures 1.6 x 1.4 x 0.5 cm
(previously 1.6 x 1.3 x 0.4 cm).
IMPRESSION: Stable, probably benign left breast mass. Recommend continued
short-term imaging follow-up.

RECOMMENDATION:
Left breast ultrasound in 6 months.

I have discussed the findings and recommendations with the patient.
If applicable, a reminder letter will be sent to the patient
regarding the next appointment.

BI-RADS CATEGORY  3: Probably benign.

## 2022-02-20 ENCOUNTER — Ambulatory Visit: Admit: 2022-02-20 | Discharge: 2022-02-20 | Payer: PRIVATE HEALTH INSURANCE

## 2022-07-31 ENCOUNTER — Encounter: Payer: Self-pay | Admitting: Obstetrics and Gynecology

## 2022-07-31 ENCOUNTER — Ambulatory Visit (INDEPENDENT_AMBULATORY_CARE_PROVIDER_SITE_OTHER): Payer: Medicaid Other | Admitting: Obstetrics and Gynecology

## 2022-07-31 ENCOUNTER — Other Ambulatory Visit (HOSPITAL_COMMUNITY)
Admission: RE | Admit: 2022-07-31 | Discharge: 2022-07-31 | Disposition: A | Discharge status: Home / Self Care | Payer: Medicaid Other | Source: Ambulatory Visit | Attending: Obstetrics and Gynecology | Admitting: Obstetrics and Gynecology

## 2022-07-31 VITALS — BP 101/64 | HR 80 | Ht 62.0 in | Wt 115.0 lb

## 2022-07-31 DIAGNOSIS — R102 Pelvic and perineal pain: Secondary | ICD-10-CM

## 2022-07-31 DIAGNOSIS — Z23 Encounter for immunization: Secondary | ICD-10-CM | POA: Diagnosis not present

## 2022-07-31 DIAGNOSIS — Z01419 Encounter for gynecological examination (general) (routine) without abnormal findings: Secondary | ICD-10-CM | POA: Insufficient documentation

## 2022-07-31 DIAGNOSIS — N939 Abnormal uterine and vaginal bleeding, unspecified: Secondary | ICD-10-CM

## 2022-07-31 NOTE — Patient Instructions (Signed)
I will send you a MyChart message with your treatment options this afternoon

## 2022-07-31 NOTE — Progress Notes (Signed)
ANNUAL EXAM Patient name: Michele Galvan MRN [id number]  Date of birth: [birth year] Chief Complaint:   NEW GYN  History of Present Illness:   Michele Galvan is a 37 y.o. [phone] with Patient's last menstrual period was 07/06/2022 (exact date). being seen today for a routine annual exam.  Current complaints:   Growth on vagina - present x 4-22mo, getting larger, hard & painful Spotting x 3 months after intercourse  Periods with blood clots - regular cycles, changes pad q2h Pain with intercourse - deep dyspareunia Pain with ovulation and period - right sided, sharp & stabbing starts 1 week prior to period. Associated with headaches and vomiting.  Has been on hormonal birth control (pills, shot) in past, but reports frequent UTI when on hormonal contraceptives.    Upstream - 07/31/22 1410       Pregnancy Intention Screening   Does the patient want to become pregnant in the next year? No    Does the patient's partner want to become pregnant in the next year? No    Would the patient like to discuss contraceptive options today? No      Contraception Wrap Up   Current Method Withdrawal or Other Method            The pregnancy intention screening data noted above was reviewed. Potential methods of contraception were discussed. The patient elected to proceed with No data recorded.   Last pap none in our system. Last mammogram: 01/03/21. Results were: BI-RADS 3, following q26mo. Family h/o breast cancer: no Last colonoscopy: n/a Family h/o colorectal cancer: no HPV vaccine: never     07/31/2022    2:11 PM 08/01/2016    3:59 PM 07/01/2016    4:43 PM 12/21/2015    9:12 AM 06/01/2015   12:56 PM  Depression screen PHQ 2/9  Decreased Interest 1 0 0 0 0  Down, Depressed, Hopeless 1 0 0 0 0  PHQ - 2 Score 2 0 0 0 0  Altered sleeping 0      Tired, decreased energy 0      Change in appetite 1      Feeling bad or failure about yourself  0      Trouble concentrating 0      Moving slowly or  fidgety/restless 0      Suicidal thoughts 0      PHQ-9 Score 3      Difficult doing work/chores Not difficult at all             No data to display           Review of Systems:   Pertinent items are noted in HPI Denies any headaches, blurred vision, fatigue, shortness of breath, chest pain, abdominal pain, abnormal vaginal discharge/itching/odor/irritation, bowel movements, or urination unless otherwise stated above. Pertinent History Reviewed:  Reviewed past medical,surgical, social and family history.  Reviewed problem list, medications and allergies. Physical Assessment:   Vitals:   07/31/22 1402  BP: 101/64  Pulse: 80  Weight: 115 lb (52.2 kg)  Height: 5\' 2"  (1.575 m)  Body mass index is 21.03 kg/m.        Physical Examination:   General appearance - well appearing, and in no distress  Mental status - alert, oriented to person, place, and time  Chest - respiratory effort normal  Heart - normal peripheral perfusion  Breasts - breasts appear normal, small nodule on left breast already being followed. Otherwise no suspicious masses, no skin or nipple  changes or axillary nodes  Abdomen - soft, nontender, nondistended, no masses or organomegaly  Pelvic - VULVA: normal appearing vulva with no masses. Small 73mm skin tag noted on left buttock/perineum.  VAGINA: normal appearing vagina with normal color and discharge, no lesions  CERVIX: cervical polyps present. Tethering of cervix and left uterosacral ligament  Thin prep pap is done with HR HPV cotesting  UTERUS: tender but normal size, shape, consistency   ADNEXA: No adnexal masses or tenderness noted.  Chaperone present for exam  No results found for this or any previous visit (from the past 24 hour(s)).  Assessment & Plan:  1) Well-Woman Exam Mammogram: continue q60mo breast US/mammogram Colonoscopy: @ 38VF, or sooner if problems Pap: collected Gardasil: ordered GC/CT: collected HIV/HCV: ordered  2) AUB, pelvic  pain We reviewed a focused differential diagnosis for AUB that includes, but is not limited to endometrial/endocervical polyps, adenomyosis, fibroids, and thyroid disease. We also discussed endometriosis or dysmenorrhea as etiologies of her pelvic pain. Given her history, exam & work up thus far, the most likely etiology is adenomyosis & cervical polyps. GC/CT/trich collected Pap collected TSH Pelvic ultrasound We reviewed management options including COCPs (continuous & cyclic), patch, ring, progestins (continuous & cyclic), DMPA (Depo Provera), LNG-IUD (Mirena), scheduled NSAIDs, and GnRH antagonists with add back therapy (Myfembree/Oriahnn). Discussed option for procedural/surgical management, but would like to review test results before making final recommendation. After counseling, [name] would like to consider her options.  Labs/procedures today:  Orders Placed This Encounter  Procedures   US PELVIC COMPLETE WITH TRANSVAGINAL   HPV 9-valent vaccine,Recombinat   HIV antibody (with reflex)   Hepatitis C Antibody   RPR   TSH   Meds: No orders of the defined types were placed in this encounter.  Follow-up: Return in about 4 weeks (around 08/28/2022).  Inez Catalina, MD 07/31/2022 8:11 PM

## 2022-07-31 NOTE — Progress Notes (Addendum)
37 y.o New GYN presents for AEX/PAP.  C/o spotting after sex and growth on perianal area, sharp pelvic pain 7/10 during ovulation x 1+ year and heavy periods.

## 2022-08-01 ENCOUNTER — Telehealth: Payer: Self-pay | Admitting: Emergency Medicine

## 2022-08-01 LAB — CERVICOVAGINAL ANCILLARY ONLY
Chlamydia: NEGATIVE
Comment: NEGATIVE
Comment: NEGATIVE
Comment: NORMAL
Neisseria Gonorrhea: NEGATIVE
Trichomonas: NEGATIVE

## 2022-08-01 LAB — RPR: RPR Ser Ql: NONREACTIVE

## 2022-08-01 LAB — TSH: TSH: 2.09 u[IU]/mL (ref 0.450–4.500)

## 2022-08-01 LAB — HIV ANTIBODY (ROUTINE TESTING W REFLEX): HIV Screen 4th Generation wRfx: NONREACTIVE

## 2022-08-01 LAB — HEPATITIS C ANTIBODY: Hep C Virus Ab: NONREACTIVE

## 2022-08-01 NOTE — Telephone Encounter (Signed)
-----   Message from Inez Catalina, MD sent at 08/01/2022 11:57 AM EST ----- Normal results - MyChart not active. Can we notify pt? Thanks! - KF

## 2022-08-01 NOTE — Telephone Encounter (Signed)
TC to patient to inform of results.  

## 2022-08-02 ENCOUNTER — Telehealth: Payer: Self-pay | Admitting: Emergency Medicine

## 2022-08-02 NOTE — Telephone Encounter (Signed)
Pt informed of results.

## 2022-08-04 ENCOUNTER — Ambulatory Visit (HOSPITAL_BASED_OUTPATIENT_CLINIC_OR_DEPARTMENT_OTHER): Payer: Medicaid Other

## 2022-08-05 ENCOUNTER — Ambulatory Visit (HOSPITAL_BASED_OUTPATIENT_CLINIC_OR_DEPARTMENT_OTHER)
Admission: RE | Admit: 2022-08-05 | Discharge: 2022-08-05 | Disposition: A | Discharge status: Home / Self Care | Payer: Medicaid Other | Source: Ambulatory Visit | Attending: Obstetrics and Gynecology | Admitting: Obstetrics and Gynecology

## 2022-08-05 DIAGNOSIS — N939 Abnormal uterine and vaginal bleeding, unspecified: Secondary | ICD-10-CM

## 2022-08-05 LAB — CYTOLOGY - PAP
Comment: NEGATIVE
Diagnosis: NEGATIVE
High risk HPV: NEGATIVE

## 2022-08-06 ENCOUNTER — Telehealth: Payer: Self-pay | Admitting: Emergency Medicine

## 2022-08-06 NOTE — Telephone Encounter (Signed)
Pt informed of results.

## 2022-08-06 NOTE — Telephone Encounter (Signed)
-----   Message from Inez Catalina, MD sent at 08/05/2022  4:45 PM EST ----- Can we please let Michele Galvan know that her pelvic ultrasound was normal? Thank you! - KF

## 2022-09-03 ENCOUNTER — Ambulatory Visit (INDEPENDENT_AMBULATORY_CARE_PROVIDER_SITE_OTHER): Payer: Medicaid Other | Admitting: Obstetrics and Gynecology

## 2022-09-03 ENCOUNTER — Other Ambulatory Visit (HOSPITAL_COMMUNITY)
Admission: RE | Admit: 2022-09-03 | Discharge: 2022-09-03 | Disposition: A | Discharge status: Home / Self Care | Payer: Medicaid Other | Source: Ambulatory Visit | Attending: Obstetrics and Gynecology | Admitting: Obstetrics and Gynecology

## 2022-09-03 ENCOUNTER — Encounter: Payer: Self-pay | Admitting: Obstetrics and Gynecology

## 2022-09-03 VITALS — BP 108/69 | HR 74 | Wt 118.0 lb

## 2022-09-03 DIAGNOSIS — N841 Polyp of cervix uteri: Secondary | ICD-10-CM | POA: Insufficient documentation

## 2022-09-03 DIAGNOSIS — N941 Unspecified dyspareunia: Secondary | ICD-10-CM

## 2022-09-03 DIAGNOSIS — N939 Abnormal uterine and vaginal bleeding, unspecified: Secondary | ICD-10-CM

## 2022-09-03 NOTE — Patient Instructions (Addendum)
Please call if you have any fevers or heavy vaginal bleeding (more than 2 pads per hour for 2 consecutive hours).  Treatment for heavy periods -  Birth control pills, patches, ring Progesterone only pills Hormonal IUD "Anti-hormones" (brand names Myfembree, Oriahnn) Lysteda/Tranexamic acid to take for 5 days per month Endometrial ablation - burning the lining of the uterus Hysterectomy

## 2022-09-03 NOTE — Progress Notes (Signed)
   RETURN GYNECOLOGY VISIT  Subjective:  Michele Galvan is a 37 y.o. [id number] with LMP 08/07/22 presenting for follow up of AUB  Last seen by myself on 07/31/22. Reported spotting after intercourse x 3 months, regular but painful & heavy menstrual cycles, and deep dyspareunia. Exam revealed cervical polyps, tethering of cervical and L uterosacral ligament, and a mobile but tender uterus.  GC/CT/trich negative Pap NILM/HPV neg TSH 2.09 Pelvic US normal w/ 10.4 x 5.5 x 5.6cm uterus w/ 66mm EL, normal ovaries.  Pt was unsure of what she wanted for management, so opted to discuss today.   Today, reports daily spotting since her last appointment. Bleeding is currently heavier and she thinks she has her period. Would like removal of cervical polyps. Notes history of frequent UTI and urinary frequency. No other changes to history.  I personally reviewed the patient's GC/CT, pap, TSH and pelvic ultrasound.  Objective:   Vitals:   09/03/22 1334  BP: 108/69  Pulse: 74  Weight: 118 lb (53.5 kg)   General:  Alert, oriented and cooperative. Patient is in no acute distress.  Skin: Skin is warm and dry. No rash noted.   Cardiovascular: Normal heart rate noted  Respiratory: Normal respiratory effort, no problems with respiration noted  Abdomen: Soft, nontender  Pelvic: NEFG Small cervical polyp at 3 o'clock, nabothian cyst at 11 o'clock <30mm dark blood in vault, no active bleeding   Assessment and Plan:  Michele Galvan is a 37 y.o. with AUB, pelvic pain  AUB  Cervical polyp - Discussed etiology of cervical +/- endometrial polyp for her intermenstrual/post coital spotting. Discussed possible adenomyosis vs dysmenorrhea for her painful, heavy cycles. - Cervical polyp removed today, see procedure note below - Discussed hsc D&C if persistent intermenstrual spotting - For her painful, heavy cycles we reviewed management options including COCPs (continuous & cyclic), patch, ring, progestins (continuous &  cyclic), DMPA (Depo Provera), LNG-IUD (Mirena), Lysteda (TXA), GnRH antagonists with add back therapy (Myfembree/Oriahnn), endometrial ablation, and hysterectomy - Michele Galvan would like to consider management options and let us know what she ultimately decides  2. Dyspareunia in female - Ambulatory referral to Physical Therapy  Return in about 4 weeks (around 10/01/2022) for return gyn follow up.  Future Appointments  Date Time Provider [place]  09/30/2022 10:00 AM [place], MD      GYNECOLOGY OFFICE PROCEDURE NOTE  37 y.o. [phone] here for removal of cervical polyps  Informed consent and review of risks, benefit and alternatives performed. Written consent given.   Speculum inserted into patient's vagina assuring full view of cervix and vaginal walls. 3 swabs of betadine applied. 1cm cervical polyp noted at 3 o'clock at external os. Polyp grasped with ring forcep and removed. Monsel's solution applied. Hemostasis excellent. All instruments removed.  All specimens were labeled and sent to pathology.  Patient was given post procedure instructions.  Will follow up pathology and manage accordingly.  Gale Journey, MD [place], [facility] for [employer], [place]

## 2022-09-03 NOTE — Progress Notes (Signed)
Pt would like to know if polyps can be removed.  Pt states she has been bleeding non stop, spotting to heavy.

## 2022-09-05 LAB — SURGICAL PATHOLOGY

## 2022-09-30 ENCOUNTER — Ambulatory Visit (INDEPENDENT_AMBULATORY_CARE_PROVIDER_SITE_OTHER): Payer: Medicaid Other

## 2022-09-30 VITALS — BP 118/78 | HR 70 | Wt 119.0 lb

## 2022-09-30 DIAGNOSIS — Z23 Encounter for immunization: Secondary | ICD-10-CM

## 2022-09-30 NOTE — Progress Notes (Signed)
Michele Galvan is here for their 2nd Gardasil injection. Pt denies any issues at this time. Pt requested inj. to be given in [place]. She tolerated the injection well. Pt agrees to follow up in 4 months for the final injection.

## 2022-10-07 ENCOUNTER — Encounter: Payer: Self-pay | Admitting: Obstetrics and Gynecology

## 2022-10-07 ENCOUNTER — Ambulatory Visit (INDEPENDENT_AMBULATORY_CARE_PROVIDER_SITE_OTHER): Payer: Medicaid Other | Admitting: Obstetrics and Gynecology

## 2022-10-07 VITALS — BP 115/75 | HR 82 | Ht 62.0 in | Wt 120.0 lb

## 2022-10-07 DIAGNOSIS — Z8742 Personal history of other diseases of the female genital tract: Secondary | ICD-10-CM

## 2022-10-07 DIAGNOSIS — Z9889 Other specified postprocedural states: Secondary | ICD-10-CM

## 2022-10-07 NOTE — Progress Notes (Signed)
Pt reports she feels much better since having the polyps removed. She states her periods are regular now and not heavy. No concerns at this time.

## 2022-10-07 NOTE — Progress Notes (Signed)
   RETURN GYNECOLOGY VISIT  Subjective:  Michele Galvan is a 37 y.o. [id number] with LMP 10/01/22 presenting for follow up of AUB s/p removal of cervical polyp.  I have been following Michele Galvan since 07/31/22. We completed the following for work up of postcoital/intermenstrual spotting, regular but painful & heavy menstrual cycles, and deep dyspareunia: GC/CT/trich negative Pap NILM/HPV neg TSH 2.09 Pelvic US normal w/ 10.4 x 5.5 x 5.6cm uterus w/ 87mm EL, normal ovaries. She had cervical polyp present. We removed her polyp on 03/01/68 without complication. Her pathology report confirmed benign endocervical polyp.   Today, she reports that her symptoms have resolved. She had a period this month without issue. She has no concerns at this time.   Objective:   Vitals:   10/07/22 1532  BP: 115/75  Pulse: 82  Weight: 120 lb (54.4 kg)  Height: 5\' 2"  (1.575 m)    General:  Alert, oriented and cooperative. Patient is in no acute distress.  Skin: Skin is warm and dry. No rash noted.   Cardiovascular: Normal heart rate noted  Respiratory: Normal respiratory effort, no problems with respiration noted   Assessment and Plan:  Michele Galvan is a 37 y.o. s/p removal of cervical polyp that was causing her post coital and intermenstrual bleeding  We reviewed her benign pathology today. Reviewed that she does not need further treatment if her symptoms have resolved. She would like to avoid hormonal medications anyway. She will continue monitoring her symptoms and call if she has any new questions or conerns.   Future Appointments  Date Time Provider [place]  10/24/2022  3:30 PM Jule Economy, PT [facility] None  01/31/2023  8:20 AM [place] None   Inez Catalina, MD

## 2022-10-24 ENCOUNTER — Ambulatory Visit: Payer: Medicaid Other

## 2022-10-29 ENCOUNTER — Ambulatory Visit: Payer: Medicaid Other

## 2022-12-12 ENCOUNTER — Ambulatory Visit: Payer: Medicaid Other

## 2022-12-16 ENCOUNTER — Ambulatory Visit: Admit: 2022-12-16 | Discharge: 2022-12-16 | Payer: PRIVATE HEALTH INSURANCE

## 2022-12-16 DIAGNOSIS — B181 Chronic viral hepatitis B without delta-agent: Principal | ICD-10-CM

## 2022-12-16 MED ORDER — TENOFOVIR DISOPROXIL FUMARATE 300 MG TABLET
ORAL_TABLET | Freq: Every day | ORAL | 11 refills | 30 days | Status: CP
Start: 2022-12-16 — End: ?

## 2023-01-31 ENCOUNTER — Ambulatory Visit: Payer: Medicaid Other

## 2023-02-06 ENCOUNTER — Ambulatory Visit: Payer: Medicaid Other | Attending: Obstetrics and Gynecology

## 2023-02-19 ENCOUNTER — Encounter: Payer: Self-pay | Admitting: Obstetrics and Gynecology

## 2023-02-24 ENCOUNTER — Ambulatory Visit (INDEPENDENT_AMBULATORY_CARE_PROVIDER_SITE_OTHER): Payer: Medicaid Other | Admitting: *Deleted

## 2023-02-24 VITALS — BP 118/71 | HR 92

## 2023-02-24 DIAGNOSIS — Z23 Encounter for immunization: Secondary | ICD-10-CM

## 2023-02-24 NOTE — Progress Notes (Signed)
Michele Galvan is here for her 3rd gardasil injection. Pt denies any issues at this time. Injection given in LD. Pt tolerated well. Vaccine series complete.

## 2023-03-12 ENCOUNTER — Emergency Department (HOSPITAL_COMMUNITY): Payer: Medicaid Other

## 2023-03-12 ENCOUNTER — Emergency Department (HOSPITAL_COMMUNITY)
Admission: EM | Admit: 2023-03-12 | Discharge: 2023-03-13 | Disposition: A | Discharge status: Home / Self Care | Payer: Medicaid Other | Attending: Emergency Medicine | Admitting: Emergency Medicine

## 2023-03-12 ENCOUNTER — Other Ambulatory Visit: Payer: Self-pay

## 2023-03-12 ENCOUNTER — Encounter (HOSPITAL_COMMUNITY): Payer: Self-pay

## 2023-03-12 DIAGNOSIS — R079 Chest pain, unspecified: Secondary | ICD-10-CM | POA: Diagnosis not present

## 2023-03-12 DIAGNOSIS — Z91040 Latex allergy status: Secondary | ICD-10-CM | POA: Diagnosis not present

## 2023-03-12 DIAGNOSIS — R002 Palpitations: Secondary | ICD-10-CM | POA: Insufficient documentation

## 2023-03-12 DIAGNOSIS — Z7951 Long term (current) use of inhaled steroids: Secondary | ICD-10-CM | POA: Insufficient documentation

## 2023-03-12 DIAGNOSIS — J45909 Unspecified asthma, uncomplicated: Secondary | ICD-10-CM | POA: Diagnosis not present

## 2023-03-12 LAB — BASIC METABOLIC PANEL
Anion gap: 13 (ref 5–15)
BUN: <5 mg/dL — ABNORMAL LOW (ref 6–20)
CO2: 23 mmol/L (ref 22–32)
Calcium: 9.2 mg/dL (ref 8.9–10.3)
Chloride: 101 mmol/L (ref 98–111)
Creatinine, Ser: 0.77 mg/dL (ref 0.44–1.00)
GFR, Estimated: >60 mL/min (ref >60)
Glucose, Bld: 95 mg/dL (ref 70–99)
Potassium: 3.8 mmol/L (ref 3.5–5.1)
Sodium: 137 mmol/L (ref 135–145)

## 2023-03-12 LAB — TROPONIN I (HIGH SENSITIVITY)
Troponin I (High Sensitivity): <2 ng/L (ref <18)
Troponin I (High Sensitivity): <2 ng/L (ref <18)

## 2023-03-12 LAB — CBC
HCT: 41.2 % (ref 36.0–46.0)
Hemoglobin: 13.2 g/dL (ref 12.0–15.0)
MCH: 28.6 pg (ref 26.0–34.0)
MCHC: 32 g/dL (ref 30.0–36.0)
MCV: 89.4 fL (ref 80.0–100.0)
Platelets: 274 10*3/uL (ref 150–400)
RBC: 4.61 MIL/uL (ref 3.87–5.11)
RDW: 12.3 % (ref 11.5–15.5)
WBC: 8.4 10*3/uL (ref 4.0–10.5)
nRBC: 0 % (ref 0.0–0.2)

## 2023-03-12 LAB — HCG, SERUM, QUALITATIVE: Preg, Serum: NEGATIVE

## 2023-03-12 NOTE — ED Triage Notes (Signed)
Pt reports heart palpitations since June. She states she had multiple today that caused her to feel dizzy and lightheaded. She has an appt with cardiology on Sept 24. She reports mild chest pain when these occur.

## 2023-03-13 ENCOUNTER — Other Ambulatory Visit: Payer: Self-pay

## 2023-03-13 NOTE — ED Provider Notes (Signed)
[place] EMERGENCY DEPARTMENT AT [facility] Provider Note   CSN: [id number] Arrival date & time: 03/12/23  1732     History  Chief Complaint  Patient presents with   Palpitations    Michele Galvan is a 37 y.o. female.  The history is provided by the patient.  Palpitations Palpitations quality:  Fast Timing:  Intermittent Progression:  Waxing and waning Chronicity:  New Context: not caffeine, not dehydration, not exercise and not stimulant use   Relieved by:  Nothing Worsened by:  Nothing Ineffective treatments:  None tried Associated symptoms: chest pressure   Associated symptoms: no chest pain, no nausea, no shortness of breath, no syncope and no vomiting   Patient with anxiety with ongoing palpitations since June. No travel, no leg pain.  No SOB.       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 Authorizing Provider  albuterol (VENTOLIN) (5 MG/ML) 0.5% NEBU Take by nebulization continuous.    [provider]  levocetirizine (XYZAL) 5 MG tablet Take 5 mg by mouth every evening. Patient not taking: Reported on 02/24/2023    [provider]  Prenatal Vit-Fe Fumarate-FA (PRENATAL VITAMIN PO) Take by mouth. Patient not taking: Reported on 02/24/2023    [provider]  tenofovir (VIREAD) 300 MG tablet Take 300 mg by mouth at bedtime.    [provider]      Allergies    Latex    Review of Systems   Review of Systems  Constitutional:  Negative for fever.  HENT:  Negative for facial swelling.   Eyes:  Negative for photophobia and visual disturbance.  Respiratory:  Negative for shortness of breath.   Cardiovascular:  Positive for palpitations. Negative for chest pain, leg swelling  and syncope.  Gastrointestinal:  Negative for nausea and vomiting.  All other systems reviewed and are negative.   Physical Exam Updated Vital Signs BP (!) 104/53 (BP Location: Left Arm)   Pulse 73   Temp 98.2 F (36.8 C)   Resp 16   Ht 5\' 2"  (1.575 m)   Wt 56.7 kg   LMP 02/28/2023 (Approximate)   SpO2 99%   Breastfeeding No   BMI 22.86 kg/m  Physical Exam Vitals and nursing note reviewed.  Constitutional:      General: She is not in acute distress.    Appearance: She is well-developed.  HENT:     Head: Normocephalic and atraumatic.     Nose: Nose normal.  Eyes:     Pupils: Pupils are equal, round, and reactive to light.  Cardiovascular:     Rate and Rhythm: Normal rate and regular rhythm.     Pulses: Normal pulses.     Heart sounds: Normal heart sounds.  Pulmonary:     Effort: Pulmonary effort is normal. No respiratory distress.     Breath sounds: Normal breath sounds.  Abdominal:     General: Bowel sounds are normal. There is no distension.     Palpations: Abdomen is soft.     Tenderness: There is no abdominal tenderness. There is no guarding or rebound.  Genitourinary:    Vagina: No vaginal discharge.  Musculoskeletal:  General: Normal range of motion.     Cervical back: Neck supple.  Skin:    General: Skin is warm and dry.     Capillary Refill: Capillary refill takes less than 2 seconds.     Findings: No erythema or rash.  Neurological:     General: No focal deficit present.     Mental Status: She is oriented to person, place, and time.     Deep Tendon Reflexes: Reflexes normal.  Psychiatric:        Mood and Affect: Mood normal.     ED Results / Procedures / Treatments   Labs (all labs ordered are listed, but only abnormal results are displayed) Results for orders placed or performed during the hospital encounter of 03/12/23  Basic metabolic panel  Result Value Ref Range   Sodium 137 135 - 145 mmol/L   Potassium 3.8 3.5 - 5.1 mmol/L    Chloride 101 98 - 111 mmol/L   CO2 23 22 - 32 mmol/L   Glucose, Bld 95 70 - 99 mg/dL   BUN <5 (L) 6 - 20 mg/dL   Creatinine, Ser 1.91 0.44 - 1.00 mg/dL   Calcium 9.2 8.9 - 47.8 mg/dL   GFR, Estimated >29 >56 mL/min   Anion gap 13 5 - 15  CBC  Result Value Ref Range   WBC 8.4 4.0 - 10.5 K/uL   RBC 4.61 3.87 - 5.11 MIL/uL   Hemoglobin 13.2 12.0 - 15.0 g/dL   HCT 21.3 08.6 - 57.8 %   MCV 89.4 80.0 - 100.0 fL   MCH 28.6 26.0 - 34.0 pg   MCHC 32.0 30.0 - 36.0 g/dL   RDW 46.9 62.9 - 52.8 %   Platelets 274 150 - 400 K/uL   nRBC 0.0 0.0 - 0.2 %  hCG, serum, qualitative  Result Value Ref Range   Preg, Serum NEGATIVE NEGATIVE  Troponin I (High Sensitivity)  Result Value Ref Range   Troponin I (High Sensitivity) <2 <18 ng/L  Troponin I (High Sensitivity)  Result Value Ref Range   Troponin I (High Sensitivity) <2 <18 ng/L   DG Chest 2 View  Result Date: 03/12/2023 CLINICAL DATA:  Chest pain EXAM: CHEST - 2 VIEW COMPARISON:  None Available. FINDINGS: The heart size and mediastinal contours are within normal limits. Both lungs are clear. The visualized skeletal structures are unremarkable. IMPRESSION: No active cardiopulmonary disease. Electronically Signed   By: Jasmine Pang M.D.   On: 03/12/2023 19:48    EKG EKG Interpretation Date/Time:  Wednesday March 12 2023 18:16:29 EDT Ventricular Rate:  82 PR Interval:  140 QRS Duration:  70 QT Interval:  360 QTC Calculation: 420 R Axis:   59  Text Interpretation: Normal sinus rhythm Confirmed by Nicanor Alcon, Giliana Vantil ([id number]) on 03/13/2023 1:07:49 AM  Radiology DG Chest 2 View  Result Date: 03/12/2023 CLINICAL DATA:  Chest pain EXAM: CHEST - 2 VIEW COMPARISON:  None Available. FINDINGS: The heart size and mediastinal contours are within normal limits. Both lungs are clear. The visualized skeletal structures are unremarkable. IMPRESSION: No active cardiopulmonary disease. Electronically Signed   By: Jasmine Pang M.D.   On: 03/12/2023 19:48     Procedures Procedures    Medications Ordered in ED Medications - No data to display  ED Course/ Medical Decision Making/ A&P  Medical Decision Making Patient with palpitations  Amount and/or Complexity of Data Reviewed External Data Reviewed: notes.    Details: Previous notes reviewed  Labs: ordered.    Details: 2 negative troponins <2 pregnancy is negative. Normal sodium 137, normal potassium 3.8, normal creatinine. Normal white count 8.4, normal hemoglobin 13.2  Radiology: ordered and independent interpretation performed.    Details: Negative chest Xray  ECG/medicine tests: ordered and independent interpretation performed. Decision-making details documented in ED Course.  Risk Risk Details: Patient is well appearing.  Nothing on monitor. Ruled out for Mi heart score.  HEART score 0 very low risk for MACE. PERC negative wells 0. Highly doubt PE in this low risk patient.      Final Clinical Impression(s) / ED Diagnoses  Return for intractable cough, coughing up blood, fevers > 100.4 unrelieved by medication, shortness of breath, intractable vomiting, chest pain, shortness of breath, weakness, numbness, changes in speech, facial asymmetry, abdominal pain, passing out, Inability to tolerate liquids or food, cough, altered mental status or any concerns. No signs of systemic illness or infection. The patient is nontoxic-appearing on exam and vital signs are within normal limits.  I have reviewed the triage vital signs and the nursing notes. Pertinent labs & imaging results that were available during my care of the patient were reviewed by me and considered in my medical decision making (see chart for details). After history, exam, and medical workup I feel the patient has been appropriately medically screened and is safe for discharge home. Pertinent diagnoses were discussed with the patient. Patient was given return precautions.     Jazen Spraggins,  MD 03/13/23 [phone]

## 2023-03-13 NOTE — Progress Notes (Signed)
Patient was assessed and managed by nursing staff during this encounter. I have reviewed the chart and agree with the documentation and plan. I have also made any necessary editorial changes.  Lennart Pall, MD 03/13/2023 10:20 PM

## 2023-04-22 ENCOUNTER — Ambulatory Visit: Payer: Medicaid Other | Admitting: Internal Medicine

## 2023-04-29 ENCOUNTER — Encounter: Payer: Self-pay | Admitting: Cardiology

## 2023-04-29 ENCOUNTER — Ambulatory Visit: Payer: Medicaid Other | Attending: Internal Medicine | Admitting: Cardiology

## 2023-04-29 ENCOUNTER — Ambulatory Visit (INDEPENDENT_AMBULATORY_CARE_PROVIDER_SITE_OTHER): Payer: Medicaid Other

## 2023-04-29 VITALS — BP 102/68 | HR 88 | Ht 62.0 in | Wt 122.0 lb

## 2023-04-29 DIAGNOSIS — R002 Palpitations: Secondary | ICD-10-CM | POA: Insufficient documentation

## 2023-04-29 DIAGNOSIS — R42 Dizziness and giddiness: Secondary | ICD-10-CM | POA: Diagnosis not present

## 2023-04-29 DIAGNOSIS — R55 Syncope and collapse: Secondary | ICD-10-CM | POA: Diagnosis not present

## 2023-04-29 DIAGNOSIS — R0602 Shortness of breath: Secondary | ICD-10-CM

## 2023-04-29 DIAGNOSIS — I459 Conduction disorder, unspecified: Secondary | ICD-10-CM | POA: Insufficient documentation

## 2023-04-29 NOTE — Progress Notes (Signed)
  Cardiology Office Note:  .   Date:  04/29/2023  ID:  Ozell Ferrera, DOB 1986/02/11, MRN [id number] PCP: Norm Salt, PA  [facility] Providers Cardiologist:  Truett Mainland, MD (New patient visit) PCP: Colman Cater, PA  History of Present Illness: .    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  She does not do any regular physical activity, other than playing with her kids in the park etc.  She has had complaints of skipped beat lasting for several hours, occurring several days at a time, sometimes associated with shortness of breath and presyncope.  She denies any chest pain or syncope.  She does not drink alcohol, drinks caffeine occasionally.  No family history of severe arrhythmia issues.  Vitals:   04/29/23 0940  BP: 102/68  Pulse: 88  SpO2: 99%     ROS:  Review of Systems  Cardiovascular:  Positive for palpitations. Negative for chest pain, dyspnea on exertion, leg swelling and syncope.  Respiratory:  Positive for shortness of breath.   Neurological:  Positive for light-headedness.     Studies Reviewed: Marland Kitchen        EKG 03/12/2023: Sinus rhythm 82 bpm Normal EKG     Physical Exam:   Physical Exam Vitals and nursing note reviewed.  Constitutional:      General: She is not in acute distress. Neck:     Vascular: No JVD.  Cardiovascular:     Rate and Rhythm: Normal rate and regular rhythm.     Heart sounds: Normal heart sounds. No murmur heard. Pulmonary:     Effort: Pulmonary effort is normal.     Breath sounds: Normal breath sounds. No wheezing or rales.  Musculoskeletal:     Right lower leg: No edema.     Left lower leg: No edema.    Visit diagnoses:   ICD-10-CM   1. Palpitations  R00.2 LONG TERM MONITOR (3-14 DAYS)    ECHOCARDIOGRAM COMPLETE    2. Postural dizziness with presyncope  R42 ECHOCARDIOGRAM COMPLETE   R55     3. Shortness of breath  R06.02 ECHOCARDIOGRAM  COMPLETE    4. Skipped beats  I45.9 LONG TERM MONITOR (3-14 DAYS)    ECHOCARDIOGRAM COMPLETE        ASSESSMENT AND PLAN: .     37 year old Asian American female with palpitations  Palpitations: Suspect frequent PACs/PVCs.  Associated shortness of breath and presyncope. Recommend echocardiogram and Zio patch monitor. Reduce caffeine intake.  F/u as needed depending on results of above tests  Signed, Elder Negus, MD

## 2023-04-29 NOTE — Progress Notes (Unsigned)
Enrolled patient for a 14 day Zio XT  monitor to be mailed to patients home  °

## 2023-04-29 NOTE — Patient Instructions (Signed)
Medication Instructions:   *If you need a refill on your cardiac medications before your next appointment, please call your pharmacy*   Lab Work:  If you have labs (blood work) drawn today and your tests are completely normal, you will receive your results only by: MyChart Message (if you have MyChart) OR A paper copy in the mail If you have any lab test that is abnormal or we need to change your treatment, we will call you to review the results.   Testing/Procedures: Christena Deem- Long Term Monitor Instructions  Your physician has requested you wear a ZIO patch monitor for 14 days.  This is a single patch monitor. [employer] supplies one patch monitor per enrollment. Additional stickers are not available. Please do not apply patch if you will be having a Nuclear Stress Test,  Echocardiogram, Cardiac CT, MRI, or Chest Xray during the period you would be wearing the  monitor. The patch cannot be worn during these tests. You cannot remove and re-apply the  ZIO XT patch monitor.  Your ZIO patch monitor will be mailed 3 day USPS to your address on file. It may take 3-5 days  to receive your monitor after you have been enrolled.  Once you have received your monitor, please review the enclosed instructions. Your monitor  has already been registered assigning a specific monitor serial # to you.  Billing and Patient Assistance Program Information  We have supplied [employer] with any of your insurance information on file for billing purposes. [employer] offers a sliding scale Patient Assistance Program for patients that do not have  insurance, or whose insurance does not completely cover the cost of the ZIO monitor.  You must apply for the Patient Assistance Program to qualify for this discounted rate.  To apply, please call [employer] at [phone], select option 4, select option 2, ask to apply for  Patient Assistance Program. Meredeth Ide will ask your household income, and how many people  are in your  household. They will quote your out-of-pocket cost based on that information.  [employer] will also be able to set up a 59-month, interest-free payment plan if needed.  Applying the monitor   Shave hair from upper left chest.  Hold abrader disc by orange tab. Rub abrader in 40 strokes over the upper left chest as  indicated in your monitor instructions.  Clean area with 4 enclosed alcohol pads. Let dry.  Apply patch as indicated in monitor instructions. Patch will be placed under collarbone on left  side of chest with arrow pointing upward.  Rub patch adhesive wings for 2 minutes. Remove white label marked "1". Remove the white  label marked "2". Rub patch adhesive wings for 2 additional minutes.  While looking in a mirror, press and release button in center of patch. A small green light will  flash 3-4 times. This will be your only indicator that the monitor has been turned on.  Do not shower for the first 24 hours. You may shower after the first 24 hours.  Press the button if you feel a symptom. You will hear a small click. Record Date, Time and  Symptom in the Patient Logbook.  When you are ready to remove the patch, follow instructions on the last 2 pages of Patient  Logbook. Stick patch monitor onto the last page of Patient Logbook.  Place Patient Logbook in the blue and white box. Use locking tab on box and tape box closed  securely. The blue and white box has prepaid  postage on it. Please place it in the mailbox as  soon as possible. Your physician should have your test results approximately 7 days after the  monitor has been mailed back to [facility].  Call [facility] Customer Care at [phone] if you have questions regarding  your ZIO XT patch monitor. Call them immediately if you see an orange light blinking on your  monitor.  If your monitor falls off in less than 4 days, contact our Monitor department at [phone].  If your monitor becomes loose or falls off after  4 days call [employer] at [phone] for  suggestions on securing your monitor   Your physician has requested that you have an echocardiogram. Echocardiography is a painless test that uses sound waves to create images of your heart. It provides your doctor with information about the size and shape of your heart and how well your heart's chambers and valves are working. This procedure takes approximately one hour. There are no restrictions for this procedure. Please do NOT wear cologne, perfume, aftershave, or lotions (deodorant is allowed). Please arrive 15 minutes prior to your appointment time.   FOLLOW UP AS NEEDED  At [facility], you and your health needs are our priority.  As part of our continuing mission to provide you with exceptional heart care, we have created designated Provider Care Teams.  These Care Teams include your primary Cardiologist (physician) and Advanced Practice Providers (APPs -  Physician Assistants and Nurse Practitioners) who all work together to provide you with the care you need, when you need it.  We recommend signing up for the patient portal called "MyChart".  Sign up information is provided on this After Visit Summary.  MyChart is used to connect with patients for Virtual Visits (Telemedicine).  Patients are able to view lab/test results, encounter notes, upcoming appointments, etc.  Non-urgent messages can be sent to your provider as well.   To learn more about what you can do with MyChart, go to [email or web address].

## 2023-05-01 DIAGNOSIS — R002 Palpitations: Secondary | ICD-10-CM

## 2023-05-13 ENCOUNTER — Ambulatory Visit (HOSPITAL_COMMUNITY): Payer: Medicaid Other | Attending: Cardiovascular Disease

## 2023-05-13 DIAGNOSIS — R0602 Shortness of breath: Secondary | ICD-10-CM | POA: Diagnosis present

## 2023-05-13 DIAGNOSIS — R42 Dizziness and giddiness: Secondary | ICD-10-CM | POA: Diagnosis present

## 2023-05-13 DIAGNOSIS — R002 Palpitations: Secondary | ICD-10-CM | POA: Diagnosis present

## 2023-05-13 DIAGNOSIS — I459 Conduction disorder, unspecified: Secondary | ICD-10-CM | POA: Diagnosis present

## 2023-05-13 DIAGNOSIS — R55 Syncope and collapse: Secondary | ICD-10-CM | POA: Diagnosis present

## 2023-05-13 LAB — ECHOCARDIOGRAM COMPLETE
Area-P 1/2: 4.64 cm2
S' Lateral: 2.7 cm

## 2023-06-02 ENCOUNTER — Ambulatory Visit: Admit: 2023-06-02 | Discharge: 2023-06-03 | Payer: PRIVATE HEALTH INSURANCE

## 2023-06-02 DIAGNOSIS — B181 Chronic viral hepatitis B without delta-agent: Principal | ICD-10-CM

## 2023-12-01 ENCOUNTER — Inpatient Hospital Stay: Admit: 2023-12-01 | Discharge: 2023-12-01 | Payer: Medicaid (Managed Care)

## 2023-12-01 ENCOUNTER — Ambulatory Visit: Admit: 2023-12-01 | Discharge: 2023-12-01 | Payer: Medicaid (Managed Care)

## 2023-12-01 DIAGNOSIS — B181 Chronic viral hepatitis B without delta-agent: Principal | ICD-10-CM

## 2023-12-01 MED ORDER — TENOFOVIR DISOPROXIL FUMARATE 300 MG TABLET
ORAL_TABLET | Freq: Every day | ORAL | 11 refills | 30.00000 days | Status: CP
Start: 2023-12-01 — End: ?

## 2023-12-19 ENCOUNTER — Inpatient Hospital Stay: Admit: 2023-12-19 | Discharge: 2023-12-20 | Payer: Medicaid (Managed Care)

## 2024-06-07 ENCOUNTER — Ambulatory Visit: Admit: 2024-06-07 | Discharge: 2024-06-07 | Payer: Medicaid (Managed Care)

## 2024-06-07 ENCOUNTER — Inpatient Hospital Stay: Admit: 2024-06-07 | Discharge: 2024-06-07 | Payer: Medicaid (Managed Care)

## 2024-06-07 DIAGNOSIS — K740 Hepatic fibrosis: Principal | ICD-10-CM

## 2024-06-07 DIAGNOSIS — B181 Chronic viral hepatitis B without delta-agent: Principal | ICD-10-CM
